# Patient Record
Sex: Female | Born: 1985 | Hispanic: No | Marital: Married | Smoking: Never smoker
Health system: Southern US, Community
[De-identification: ages and names within clinical notes are randomized; demographics above are authoritative.]

## PROBLEM LIST (undated history)

## (undated) DIAGNOSIS — R51 Headache: Secondary | ICD-10-CM

## (undated) DIAGNOSIS — N809 Endometriosis, unspecified: Secondary | ICD-10-CM

## (undated) DIAGNOSIS — D219 Benign neoplasm of connective and other soft tissue, unspecified: Secondary | ICD-10-CM

## (undated) DIAGNOSIS — R748 Abnormal levels of other serum enzymes: Secondary | ICD-10-CM

## (undated) DIAGNOSIS — N946 Dysmenorrhea, unspecified: Secondary | ICD-10-CM

## (undated) DIAGNOSIS — K279 Peptic ulcer, site unspecified, unspecified as acute or chronic, without hemorrhage or perforation: Secondary | ICD-10-CM

## (undated) DIAGNOSIS — K219 Gastro-esophageal reflux disease without esophagitis: Secondary | ICD-10-CM

## (undated) DIAGNOSIS — IMO0002 Reserved for concepts with insufficient information to code with codable children: Secondary | ICD-10-CM

## (undated) DIAGNOSIS — R519 Headache, unspecified: Secondary | ICD-10-CM

## (undated) DIAGNOSIS — G971 Other reaction to spinal and lumbar puncture: Secondary | ICD-10-CM

## (undated) HISTORY — PX: UPPER GI ENDOSCOPY: SHX6162

## (undated) HISTORY — DX: Peptic ulcer, site unspecified, unspecified as acute or chronic, without hemorrhage or perforation: K27.9

## (undated) HISTORY — DX: Endometriosis, unspecified: N80.9

## (undated) HISTORY — PX: NO PAST SURGERIES: SHX2092

## (undated) HISTORY — DX: Abnormal levels of other serum enzymes: R74.8

---

## 2012-06-12 DIAGNOSIS — K279 Peptic ulcer, site unspecified, unspecified as acute or chronic, without hemorrhage or perforation: Secondary | ICD-10-CM

## 2012-06-12 HISTORY — DX: Peptic ulcer, site unspecified, unspecified as acute or chronic, without hemorrhage or perforation: K27.9

## 2016-04-06 ENCOUNTER — Encounter (HOSPITAL_COMMUNITY): Payer: Self-pay | Admitting: *Deleted

## 2016-04-06 ENCOUNTER — Inpatient Hospital Stay (HOSPITAL_COMMUNITY)
Admission: AD | Admit: 2016-04-06 | Discharge: 2016-04-06 | Disposition: A | Discharge status: Home / Self Care | Payer: Self-pay | Source: Ambulatory Visit | Attending: Obstetrics and Gynecology | Admitting: Obstetrics and Gynecology

## 2016-04-06 DIAGNOSIS — N946 Dysmenorrhea, unspecified: Secondary | ICD-10-CM

## 2016-04-06 HISTORY — DX: Headache, unspecified: R51.9

## 2016-04-06 HISTORY — DX: Benign neoplasm of connective and other soft tissue, unspecified: D21.9

## 2016-04-06 HISTORY — DX: Headache: R51

## 2016-04-06 HISTORY — DX: Dysmenorrhea, unspecified: N94.6

## 2016-04-06 LAB — CBC WITH DIFFERENTIAL/PLATELET
BASOS ABS: 0.1 10*3/uL (ref 0.0–0.1)
BASOS PCT: 0 %
Eosinophils Absolute: 0.5 10*3/uL (ref 0.0–0.7)
Eosinophils Relative: 4 %
HEMATOCRIT: 31.7 % — AB (ref 36.0–46.0)
HEMOGLOBIN: 10.2 g/dL — AB (ref 12.0–15.0)
LYMPHS PCT: 15 %
Lymphs Abs: 2 10*3/uL (ref 0.7–4.0)
MCH: 21.7 pg — ABNORMAL LOW (ref 26.0–34.0)
MCHC: 32.2 g/dL (ref 30.0–36.0)
MCV: 67.4 fL — AB (ref 78.0–100.0)
Monocytes Absolute: 0.4 10*3/uL (ref 0.1–1.0)
Monocytes Relative: 3 %
NEUTROS ABS: 10.5 10*3/uL — AB (ref 1.7–7.7)
NEUTROS PCT: 78 %
Platelets: 282 10*3/uL (ref 150–400)
RBC: 4.7 MIL/uL (ref 3.87–5.11)
RDW: 15.7 % — AB (ref 11.5–15.5)
WBC: 13.4 10*3/uL — AB (ref 4.0–10.5)

## 2016-04-06 LAB — URINALYSIS, ROUTINE W REFLEX MICROSCOPIC
BILIRUBIN URINE: NEGATIVE
GLUCOSE, UA: NEGATIVE mg/dL
Ketones, ur: NEGATIVE mg/dL
Leukocytes, UA: NEGATIVE
Nitrite: NEGATIVE
Protein, ur: NEGATIVE mg/dL
SPECIFIC GRAVITY, URINE: 1.01 (ref 1.005–1.030)
pH: 7.5 (ref 5.0–8.0)

## 2016-04-06 LAB — COMPREHENSIVE METABOLIC PANEL
ALBUMIN: 4.1 g/dL (ref 3.5–5.0)
ALK PHOS: 50 U/L (ref 38–126)
ALT: 14 U/L (ref 14–54)
AST: 17 U/L (ref 15–41)
Anion gap: 8 (ref 5–15)
BILIRUBIN TOTAL: 0.6 mg/dL (ref 0.3–1.2)
BUN: 7 mg/dL (ref 6–20)
CO2: 21 mmol/L — ABNORMAL LOW (ref 22–32)
CREATININE: 0.57 mg/dL (ref 0.44–1.00)
Calcium: 8.5 mg/dL — ABNORMAL LOW (ref 8.9–10.3)
Chloride: 108 mmol/L (ref 101–111)
GFR calc Af Amer: >60 mL/min (ref >60)
GFR calc non Af Amer: >60 mL/min (ref >60)
GLUCOSE: 109 mg/dL — AB (ref 65–99)
POTASSIUM: 3.7 mmol/L (ref 3.5–5.1)
Sodium: 137 mmol/L (ref 135–145)
TOTAL PROTEIN: 6.9 g/dL (ref 6.5–8.1)

## 2016-04-06 LAB — URINE MICROSCOPIC-ADD ON
BACTERIA UA: NONE SEEN
WBC, UA: NONE SEEN WBC/hpf (ref 0–5)

## 2016-04-06 LAB — POCT PREGNANCY, URINE: Preg Test, Ur: NEGATIVE

## 2016-04-06 MED ORDER — HYDROMORPHONE HCL 1 MG/ML IJ SOLN
0.5000 mg | Freq: Once | INTRAMUSCULAR | Status: AC
Start: 2016-04-06 — End: 2016-04-06
  Administered 2016-04-06: 0.5 mg via INTRAMUSCULAR
  Filled 2016-04-06: qty 1

## 2016-04-06 MED ORDER — IBUPROFEN 600 MG PO TABS: 600.0000 mg | ORAL_TABLET | Freq: Four times a day (QID) | ORAL | 1 refills | Status: DC | PRN

## 2016-04-06 MED ORDER — TRAMADOL HCL 50 MG PO TABS: 50.0000 mg | ORAL_TABLET | Freq: Four times a day (QID) | ORAL | 0 refills | Status: DC | PRN

## 2016-04-06 MED ORDER — KETOROLAC TROMETHAMINE 60 MG/2ML IM SOLN
60.0000 mg | Freq: Once | INTRAMUSCULAR | Status: AC
  Administered 2016-04-06: 60 mg via INTRAMUSCULAR
  Filled 2016-04-06: qty 2

## 2016-04-06 MED ORDER — ONDANSETRON HCL 4 MG PO TABS
4.0000 mg | ORAL_TABLET | Freq: Once | ORAL | Status: AC
  Administered 2016-04-06: 4 mg via ORAL
  Filled 2016-04-06: qty 1

## 2016-04-06 NOTE — MAU Note (Signed)
Pt's doctor gave her medicine for painful periods and she has not had a period for 3 months; now pt is having aperiod with moderate flow but severe cramping; hx of fibroids in her uterus and dysmenorrhea;

## 2016-04-06 NOTE — Discharge Instructions (Signed)
Dysmenorrhea °Menstrual cramps (dysmenorrhea) are caused by the muscles of the uterus tightening (contracting) during a menstrual period. For some women, this discomfort is merely bothersome. For others, dysmenorrhea can be severe enough to interfere with everyday activities for a few days each month. °Primary dysmenorrhea is menstrual cramps that last a couple of days when you start having menstrual periods or soon after. This often begins after a teenager starts having her period. As a woman gets older or has a baby, the cramps will usually lessen or disappear. Secondary dysmenorrhea begins later in life, lasts longer, and the pain may be stronger than primary dysmenorrhea. The pain may start before the period and last a few days after the period.  °CAUSES  °Dysmenorrhea is usually caused by an underlying problem, such as: °· The tissue lining the uterus grows outside of the uterus in other areas of the body (endometriosis). °· The endometrial tissue, which normally lines the uterus, is found in or grows into the muscular walls of the uterus (adenomyosis). °· The pelvic blood vessels are engorged with blood just before the menstrual period (pelvic congestive syndrome). °· Overgrowth of cells (polyps) in the lining of the uterus or cervix. °· Falling down of the uterus (prolapse) because of loose or stretched ligaments. °· Depression. °· Bladder problems, infection, or inflammation. °· Problems with the intestine, a tumor, or irritable bowel syndrome. °· Cancer of the female organs or bladder. °· A severely tipped uterus. °· A very tight opening or closed cervix. °· Noncancerous tumors of the uterus (fibroids). °· Pelvic inflammatory disease (PID). °· Pelvic scarring (adhesions) from a previous surgery. °· Ovarian cyst. °· An intrauterine device (IUD) used for birth control. °RISK FACTORS °You may be at greater risk of dysmenorrhea if: °· You are younger than age 30. °· You started puberty early. °· You have  irregular or heavy bleeding. °· You have never given birth. °· You have a family history of this problem. °· You are a smoker. °SIGNS AND SYMPTOMS  °· Cramping or throbbing pain in your lower abdomen. °· Headaches. °· Lower back pain. °· Nausea or vomiting. °· Diarrhea. °· Sweating or dizziness. °· Loose stools. °DIAGNOSIS  °A diagnosis is based on your history, symptoms, physical exam, diagnostic tests, or procedures. Diagnostic tests or procedures may include: °· Blood tests. °· Ultrasonography. °· An examination of the lining of the uterus (dilation and curettage, D&C). °· An examination inside your abdomen or pelvis with a scope (laparoscopy). °· X-rays. °· CT scan. °· MRI. °· An examination inside the bladder with a scope (cystoscopy). °· An examination inside the intestine or stomach with a scope (colonoscopy, gastroscopy). °TREATMENT  °Treatment depends on the cause of the dysmenorrhea. Treatment may include: °· Pain medicine prescribed by your health care provider. °· Birth control pills or an IUD with progesterone hormone in it. °· Hormone replacement therapy. °· Nonsteroidal anti-inflammatory drugs (NSAIDs). These may help stop the production of prostaglandins. °· Surgery to remove adhesions, endometriosis, ovarian cyst, or fibroids. °· Removal of the uterus (hysterectomy). °· Progesterone shots to stop the menstrual period. °· Cutting the nerves on the sacrum that go to the female organs (presacral neurectomy). °· Electric current to the sacral nerves (sacral nerve stimulation). °· Antidepressant medicine. °· Psychiatric therapy, counseling, or group therapy. °· Exercise and physical therapy. °· Meditation and yoga therapy. °· Acupuncture. °HOME CARE INSTRUCTIONS  °· Only take over-the-counter or prescription medicines as directed by your health care provider. °· Place a heating pad   or hot water bottle on your lower back or abdomen. Do not sleep with the heating pad.  Use aerobic exercises, walking,  swimming, biking, and other exercises to help lessen the cramping.  Massage to the lower back or abdomen may help.  Stop smoking.  Avoid alcohol and caffeine. SEEK MEDICAL CARE IF:   Your pain does not get better with medicine.  You have pain with sexual intercourse.  Your pain increases and is not controlled with medicines.  You have abnormal vaginal bleeding with your period.  You develop nausea or vomiting with your period that is not controlled with medicine. SEEK IMMEDIATE MEDICAL CARE IF:  You pass out.    This information is not intended to replace advice given to you by your health care provider. Make sure you discuss any questions you have with your health care provider.   Document Released: 05/29/2005 Document Revised: 01/29/2013 Document Reviewed: 11/14/2012 Elsevier Interactive Patient Education 2016 [facility]    [facility]  [phone]     Phone: [place]                      [facility]  [email or web address]  [place]     Phone: [phone]  Phone: [place]     Phone: [phone]  Phone: [phone]           [place] [facility] @ [place]                hone: [phone]  Phone: [phone]         [facility] [address]      Phone: [phone]  Phone: [phone]         [place] [facility]                Phone: [phone]  [place])          Phone: [phone] [facility]   Phone: [phone]

## 2016-04-06 NOTE — MAU Provider Note (Signed)
Chief Complaint:  Abdominal Pain   First Provider Initiated Contact with Patient 04/06/16 1833       HPI: Sally Williams is a 30 y.o. G0P0000 who presents to maternity admissions reporting severe lower pelvic cramping which started when her period started 2 days ago, but got worse today. States this is typical of her usual periods. But has been given meds in [place] to stop her period for 3 months, so it has been a while since she has had one.  States has one 2cm fibroid in her uterus.  Took one med at home which is not on [place] formularies.  Husband states it is an antispasmodic.  Did not try any NSAIDs. She reports vaginal bleeding, no vaginal itching/burning, urinary symptoms, h/a, dizziness, n/v, or fever/chills.    Pelvic Pain  The patient's primary symptoms include pelvic pain and vaginal bleeding. The patient's pertinent negatives include no genital itching, genital lesions or genital odor. This is a recurrent problem. The current episode started in the past 7 days. The problem occurs constantly. The problem has been unchanged. The pain is severe. The problem affects both sides. She is not pregnant. Associated symptoms include abdominal pain. Pertinent negatives include no back pain, constipation, diarrhea, fever, headaches, nausea or vomiting. The vaginal discharge was bloody. The vaginal bleeding is typical of menses. She has not been passing clots. She has not been passing tissue. Nothing aggravates the symptoms. Treatments tried: [place] medicine. The treatment provided mild relief. She is sexually active. No, her partner does not have an STD. She uses nothing for contraception.   RN Note: Pt's doctor gave her medicine for painful periods and she has not had a period for 3 months; now pt is having aperiod with moderate flow but severe cramping; hx of fibroids in her uterus and dysmenorrhea; Pt reports she has had severe abd pain with her period x 3 days. Usually has bad pain with her  periods. When she was in [place]  3 months ago was given "Ilioroid" medicine to help with the pain with her period. Did not have a period for the 3 months while she was on it and now her period has stared again and she has a lot of pain.  Past Medical History: Past Medical History:  Diagnosis Date  . Dysmenorrhea   . Fibroid   . Headache     Past obstetric history: OB History  Gravida Para Term Preterm AB Living  0 0 0 0 0 0  SAB TAB Ectopic Multiple Live Births  0 0 0 0 0        Past Surgical History: Past Surgical History:  Procedure Laterality Date  . NO PAST SURGERIES      Family History: No family history on file.  Social History: Social History  Substance Use Topics  . Smoking status: Never Smoker  . Smokeless tobacco: Never Used  . Alcohol use No    Allergies: Allergies no known allergies  Meds:  No prescriptions prior to admission.    I have reviewed patient's Past Medical Hx, Surgical Hx, Family Hx, Social Hx, medications and allergies.  ROS:  Review of Systems  Constitutional: Negative for fever.  Gastrointestinal: Positive for abdominal pain. Negative for constipation, diarrhea, nausea and vomiting.  Genitourinary: Positive for pelvic pain.  Musculoskeletal: Negative for back pain.  Neurological: Negative for headaches.   Other systems negative     Physical Exam  Patient Vitals for the past 24 hrs:  BP Temp Temp src Pulse  Resp Height Weight  04/06/16 1758 138/75 98.7 F (37.1 C) Oral 63 18 4\' 10"  (1.473 m) 117 lb 12.8 oz (53.4 kg)   Constitutional: Well-developed, well-nourished female in no acute distress.  Cardiovascular: normal rate and rhythm, no ectopy audible, S1 & S2 heard, no murmur Respiratory: normal effort, no distress. Lungs CTAB with no wheezes or crackles GI: Abd soft, tender over lower abdomen.  Nondistended.  No localized tenderness . No rebound, No guarding.  Bowel Sounds audible  MS: Extremities nontender, no edema,  normal ROM Neurologic: Alert and oriented x 4.   Grossly nonfocal. GU: Neg CVAT. Skin:  Warm and Dry Psych:  Affect appropriate.  PELVIC EXAM: Refused by patient   Labs: Results for orders placed or performed during the hospital encounter of 04/06/16 (from the past 24 hour(s))  Urinalysis, Routine w reflex microscopic (not at [facility])     Status: Abnormal   Collection Time: 04/06/16  6:00 PM  Result Value Ref Range   Color, Urine YELLOW YELLOW   APPearance CLEAR CLEAR   Specific Gravity, Urine 1.010 1.005 - 1.030   pH 7.5 5.0 - 8.0   Glucose, UA NEGATIVE NEGATIVE mg/dL   Hgb urine dipstick MODERATE (A) NEGATIVE   Bilirubin Urine NEGATIVE NEGATIVE   Ketones, ur NEGATIVE NEGATIVE mg/dL   Protein, ur NEGATIVE NEGATIVE mg/dL   Nitrite NEGATIVE NEGATIVE   Leukocytes, UA NEGATIVE NEGATIVE  Urine microscopic-add on     Status: Abnormal   Collection Time: 04/06/16  6:00 PM  Result Value Ref Range   Squamous Epithelial / LPF 0-5 (A) NONE SEEN   WBC, UA NONE SEEN 0 - 5 WBC/hpf   RBC / HPF 6-30 0 - 5 RBC/hpf   Bacteria, UA NONE SEEN NONE SEEN  Pregnancy, urine POC     Status: None   Collection Time: 04/06/16  6:18 PM  Result Value Ref Range   Preg Test, Ur NEGATIVE NEGATIVE  CBC with Differential/Platelet     Status: Abnormal   Collection Time: 04/06/16  7:12 PM  Result Value Ref Range   WBC 13.4 (H) 4.0 - 10.5 K/uL   RBC 4.70 3.87 - 5.11 MIL/uL   Hemoglobin 10.2 (L) 12.0 - 15.0 g/dL   HCT 31.7 (L) 36.0 - 46.0 %   MCV 67.4 (L) 78.0 - 100.0 fL   MCH 21.7 (L) 26.0 - 34.0 pg   MCHC 32.2 30.0 - 36.0 g/dL   RDW 15.7 (H) 11.5 - 15.5 %   Platelets 282 150 - 400 K/uL   Neutrophils Relative % 78 %   Neutro Abs 10.5 (H) 1.7 - 7.7 K/uL   Lymphocytes Relative 15 %   Lymphs Abs 2.0 0.7 - 4.0 K/uL   Monocytes Relative 3 %   Monocytes Absolute 0.4 0.1 - 1.0 K/uL   Eosinophils Relative 4 %   Eosinophils Absolute 0.5 0.0 - 0.7 K/uL   Basophils Relative 0 %   Basophils Absolute 0.1 0.0 - 0.1  K/uL  Comprehensive metabolic panel     Status: Abnormal   Collection Time: 04/06/16  7:12 PM  Result Value Ref Range   Sodium 137 135 - 145 mmol/L   Potassium 3.7 3.5 - 5.1 mmol/L   Chloride 108 101 - 111 mmol/L   CO2 21 (L) 22 - 32 mmol/L   Glucose, Bld 109 (H) 65 - 99 mg/dL   BUN 7 6 - 20 mg/dL   Creatinine, Ser 0.57 0.44 - 1.00 mg/dL   Calcium 8.5 (L) 8.9 -  10.3 mg/dL   Total Protein 6.9 6.5 - 8.1 g/dL   Albumin 4.1 3.5 - 5.0 g/dL   AST 17 15 - 41 U/L   ALT 14 14 - 54 U/L   Alkaline Phosphatase 50 38 - 126 U/L   Total Bilirubin 0.6 0.3 - 1.2 mg/dL   GFR calc non Af Amer >60 >60 mL/min   GFR calc Af Amer >60 >60 mL/min   Anion gap 8 5 - 15    Imaging:  No results found.  MAU Course/MDM: I have ordered labs as follows:  CBC, Cmet, UA Imaging ordered: none Results reviewed.   Consult Dr Rip Harbour.  This seems consistent with dysmenorrhea, but since her WBC was 13, I asked if I needed to do other evaluation and he stated that this is not necessary.  .   Treatments in MAU included Toradol which provided some relief. Gave her one dose of 0.5mg  Dilaudid due to severity of her pain, which relieved it to a great extent..   Pt stable at time of discharge.  Assessment: Dysmenorrhea  Plan: Discharge home Recommend Find a GYN doctor to follow up with. Rx Ibuprofen for dysmenorrhea Rx Tramadol for severe pain, limited Rx #15 with no refills.   Encouraged to return here or to other Urgent Care/ED if she develops worsening of symptoms, increase in pain, fever, or other concerning symptoms.   Hansel Feinstein CNM, MSN Certified Nurse-Midwife 04/06/2016 7:08 PM

## 2016-04-06 NOTE — MAU Note (Signed)
Pt reports she has had severe abd pain with her period x 3 days. Usually has bad pain with her periods. When she was in [place]  3 months ago was given "Ilioroid" medicine to help with the pain with her period. Did not have a period for the 3 months while she was on it and now her period has stared again and she has a lot of pain.

## 2016-04-14 ENCOUNTER — Inpatient Hospital Stay (HOSPITAL_COMMUNITY): Payer: Self-pay

## 2016-04-14 ENCOUNTER — Encounter (HOSPITAL_COMMUNITY): Payer: Self-pay | Admitting: *Deleted

## 2016-04-14 ENCOUNTER — Inpatient Hospital Stay (HOSPITAL_COMMUNITY)
Admission: AD | Admit: 2016-04-14 | Discharge: 2016-04-14 | Disposition: A | Discharge status: Home / Self Care | Payer: Self-pay | Source: Ambulatory Visit | Attending: Obstetrics & Gynecology | Admitting: Obstetrics & Gynecology

## 2016-04-14 DIAGNOSIS — D259 Leiomyoma of uterus, unspecified: Secondary | ICD-10-CM | POA: Insufficient documentation

## 2016-04-14 DIAGNOSIS — R102 Pelvic and perineal pain: Secondary | ICD-10-CM | POA: Insufficient documentation

## 2016-04-14 MED ORDER — ONDANSETRON 8 MG PO TBDP
8.0000 mg | ORAL_TABLET | Freq: Once | ORAL | Status: AC
  Administered 2016-04-14: 8 mg via ORAL
  Filled 2016-04-14: qty 1

## 2016-04-14 MED ORDER — HYDROMORPHONE HCL 1 MG/ML IJ SOLN
1.0000 mg | Freq: Once | INTRAMUSCULAR | Status: AC
  Administered 2016-04-14: 1 mg via INTRAMUSCULAR
  Filled 2016-04-14: qty 1

## 2016-04-14 MED ORDER — TRAMADOL HCL 50 MG PO TABS: 50.0000 mg | ORAL_TABLET | Freq: Four times a day (QID) | ORAL | 0 refills | Status: DC | PRN

## 2016-04-14 NOTE — MAU Provider Note (Signed)
History     CSN: [id number]  Arrival date and time: 04/14/16 0053   None     Chief Complaint  Patient presents with  . Pelvic Pain   Sally Williams is a 30 y.o. G0P0000 who presents with abdominal pain. She was seen here on 04/06/16. She states that she was fine when she was taking the medicaiton she was given, but she has run out of medication. She reports that her period ended about 2 days ago.    Pelvic Pain  The patient's primary symptoms include pelvic pain. This is a new problem. The current episode started in the past 7 days. The problem occurs intermittently. The problem has been unchanged. The pain is severe. The problem affects the right side. She is not pregnant. Associated symptoms include abdominal pain, constipation, nausea and vomiting. Pertinent negatives include no diarrhea, dysuria, fever, frequency or urgency. The vaginal discharge was normal. There has been no bleeding. Nothing aggravates the symptoms. She has tried NSAIDs for the symptoms. The treatment provided no relief. She uses nothing for contraception.   Past Medical History:  Diagnosis Date  . Dysmenorrhea   . Fibroid   . Headache     Past Surgical History:  Procedure Laterality Date  . NO PAST SURGERIES      History reviewed. No pertinent family history.  Social History  Substance Use Topics  . Smoking status: Never Smoker  . Smokeless tobacco: Never Used  . Alcohol use No    Allergies: No Known Allergies  Prescriptions Prior to Admission  Medication Sig Dispense Refill Last Dose  . ibuprofen (ADVIL,MOTRIN) 600 MG tablet Take 1 tablet (600 mg total) by mouth every 6 (six) hours as needed. 30 tablet 1 04/13/2016 at 2330  . traMADol (ULTRAM) 50 MG tablet Take 1 tablet (50 mg total) by mouth every 6 (six) hours as needed. 15 tablet 0 04/13/2016    Review of Systems  Constitutional: Negative for fever.  Gastrointestinal: Positive for abdominal pain, constipation, nausea and vomiting. Negative  for diarrhea.  Genitourinary: Positive for pelvic pain. Negative for dysuria, frequency and urgency.   Physical Exam   Blood pressure 145/65, pulse 64, temperature 97.8 F (36.6 C), resp. rate 18, height 4\' 9"  (1.448 m), weight 119 lb 6 oz (54.1 kg), last menstrual period 04/03/2016, SpO2 100 %.  Physical Exam  Nursing note and vitals reviewed. Constitutional: She is oriented to person, place, and time. She appears well-developed and well-nourished. No distress.  HENT:  Head: Normocephalic.  Cardiovascular: Normal rate.   GI: Soft. There is tenderness. There is no rebound.  Neurological: She is alert and oriented to person, place, and time.  Skin: Skin is warm and dry.  Psychiatric: She has a normal mood and affect.   US Transvaginal Non-ob  Result Date: 04/14/2016 CLINICAL DATA:  Ultrasound was provided for use by the ordering physician, and a technical charge was applied by the performing facility.  No radiologist interpretation/professional services rendered.   US Pelvis Complete  Result Date: 04/14/2016 CLINICAL DATA:  30 y/o F; dysmenorrhea, known fibroids, lower pelvic pain severe for 1 week. Worse today. EXAM: TRANSABDOMINAL AND TRANSVAGINAL ULTRASOUND OF PELVIS TECHNIQUE: Both transabdominal and transvaginal ultrasound examinations of the pelvis were performed. Transabdominal technique was performed for global imaging of the pelvis including uterus, ovaries, adnexal regions, and pelvic cul-de-sac. It was necessary to proceed with endovaginal exam following the transabdominal exam to visualize the uterus. COMPARISON:  None FINDINGS: Uterus Measurements: 9.7 x 5.6 x 5.4  cm. Posterior lower myometrial fibroma measuring 1.4 x 1.2 x 1.4 cm. Posterior upper myometrial 4.3 x 3.5 x 3.8 cm fibroid. Subcentimeter cyst at the cervix, likely nabothian. Endometrium Thickness: 11 mm.  No focal abnormality visualized. Right ovary Measurements: 1.7 x 1.2 x 1.6 cm. Normal appearance/no adnexal mass.  Left ovary Measurements: 2.6 x 2.3 x 2.0 cm. Normal appearance/no adnexal mass. Benign left ovarian follicle for Other findings Trace pelvic fluid is likely physiologic. IMPRESSION: No acute process identified.  Multiple uterine fibroids. Electronically Signed   By: Kristine Garbe M.D.   On: 04/14/2016 03:38     MAU Course  Procedures  MDM  Assessment and Plan   1. Uterine leiomyoma, unspecified location   2. Pelvic pain in female    DC home Comfort measures reviewed  RX: tramadol PRN #15  Return to MAU as needed   [place] [facility] .   Specialty:  Obstetrics and Gynecology Why:  as scheduled for 05/18/16 at 3:00pm Contact information: [place] [phone]           Mathis Bud 04/14/2016, 1:34 AM

## 2016-04-14 NOTE — Discharge Instructions (Signed)
Uterine Fibroids Uterine fibroids are tissue masses (tumors) that can develop in the womb (uterus). They are also called leiomyomas. This type of tumor is not cancerous (benign) and does not spread to other parts of the body outside of the pelvic area, which is between the hip bones. Occasionally, fibroids may develop in the fallopian tubes, in the cervix, or on the support structures (ligaments) that surround the uterus. You can have one or many fibroids. Fibroids can vary in size, weight, and where they grow in the uterus. Some can become quite large. Most fibroids do not require medical treatment. CAUSES A fibroid can develop when a single uterine cell keeps growing (replicating). Most cells in the human body have a control mechanism that keeps them from replicating without control. SIGNS AND SYMPTOMS Symptoms may include:   Heavy bleeding during your period.  Bleeding or spotting between periods.  Pelvic pain and pressure.  Bladder problems, such as needing to urinate more often (urinary frequency) or urgently.  Inability to reproduce offspring (infertility).  Miscarriages. DIAGNOSIS Uterine fibroids are diagnosed through a physical exam. Your health care provider may feel the lumpy tumors during a pelvic exam. Ultrasonography and an MRI may be done to determine the size, location, and number of fibroids. TREATMENT Treatment may include:  Watchful waiting. This involves getting the fibroid checked by your health care provider to see if it grows or shrinks. Follow your health care provider's recommendations for how often to have this checked.  Hormone medicines. These can be taken by mouth or given through an intrauterine device (IUD).  Surgery.  Removing the fibroids (myomectomy) or the uterus (hysterectomy).  Removing blood supply to the fibroids (uterine artery embolization). If fibroids interfere with your fertility and you want to become pregnant, your health care provider  may recommend having the fibroids removed.  HOME CARE INSTRUCTIONS  Keep all follow-up visits as directed by your health care provider. This is important.  Take medicines only as directed by your health care provider.  If you were prescribed a hormone treatment, take the hormone medicines exactly as directed.  Do not take aspirin, because it can cause bleeding.  Ask your health care provider about taking iron pills and increasing the amount of dark green, leafy vegetables in your diet. These actions can help to boost your blood iron levels, which may be affected by heavy menstrual bleeding.  Pay close attention to your period and tell your health care provider about any changes, such as:  Increased blood flow that requires you to use more pads or tampons than usual per month.  A change in the number of days that your period lasts per month.  A change in symptoms that are associated with your period, such as abdominal cramping or back pain. SEEK MEDICAL CARE IF:  You have pelvic pain, back pain, or abdominal cramps that cannot be controlled with medicines.  You have an increase in bleeding between and during periods.  You soak tampons or pads in a half hour or less.  You feel lightheaded, extra tired, or weak. SEEK IMMEDIATE MEDICAL CARE IF:  You faint.  You have a sudden increase in pelvic pain.   This information is not intended to replace advice given to you by your health care provider. Make sure you discuss any questions you have with your health care provider.   Document Released: 05/26/2000 Document Revised: 06/19/2014 Document Reviewed: 11/25/2013 Elsevier Interactive Patient Education 2016 Elsevier Inc.  

## 2016-04-14 NOTE — MAU Note (Signed)
Pt reports she has continued to have pain since, vomiting today.

## 2016-05-01 ENCOUNTER — Encounter: Payer: Self-pay | Admitting: Obstetrics & Gynecology

## 2016-05-01 ENCOUNTER — Ambulatory Visit (INDEPENDENT_AMBULATORY_CARE_PROVIDER_SITE_OTHER): Payer: Self-pay | Admitting: Obstetrics & Gynecology

## 2016-05-01 VITALS — BP 119/80 | HR 80 | Ht <= 58 in | Wt 117.0 lb

## 2016-05-01 DIAGNOSIS — D251 Intramural leiomyoma of uterus: Secondary | ICD-10-CM | POA: Insufficient documentation

## 2016-05-01 DIAGNOSIS — G8929 Other chronic pain: Secondary | ICD-10-CM

## 2016-05-01 DIAGNOSIS — R102 Pelvic and perineal pain: Secondary | ICD-10-CM

## 2016-05-01 DIAGNOSIS — D500 Iron deficiency anemia secondary to blood loss (chronic): Secondary | ICD-10-CM

## 2016-05-01 NOTE — Progress Notes (Signed)
   Subjective:    Patient ID: Sally Williams, female    DOB: [birth year], 30 y.o.   MRN: [id number]  HPI  30 yo M A P0 here today to discuss CPP, started about 10 years ago, worse now. Pain is now daily. In [place] she was given a daily medication for 3 months that stopped her periods. Pain was resolved during that time. Pain is not worse with sex or with her period. She would like to have kids and has not used contraception except the 3 months that the doctor in [place] gave her those meds.  Review of Systems U/s showed several medium size fibroids. Periods are monthly, lasting 7-8 day     Objective:   Physical Exam [id number] Breathing, conversing, ambulating normally Abd- benign       Assessment & Plan:  Preventative health care- declines flu vaccine\ Free pap clinic number given Fibroids, anemia, desire for pregnacy Rec MVI daily, IBU prn Start trying for pregnancy

## 2016-05-07 ENCOUNTER — Encounter (HOSPITAL_COMMUNITY): Payer: Self-pay | Admitting: *Deleted

## 2016-05-07 ENCOUNTER — Inpatient Hospital Stay (HOSPITAL_COMMUNITY)
Admission: AD | Admit: 2016-05-07 | Discharge: 2016-05-07 | Disposition: A | Discharge status: Home / Self Care | Payer: Self-pay | Source: Ambulatory Visit | Attending: Obstetrics and Gynecology | Admitting: Obstetrics and Gynecology

## 2016-05-07 DIAGNOSIS — D259 Leiomyoma of uterus, unspecified: Secondary | ICD-10-CM | POA: Insufficient documentation

## 2016-05-07 DIAGNOSIS — R109 Unspecified abdominal pain: Secondary | ICD-10-CM | POA: Insufficient documentation

## 2016-05-07 DIAGNOSIS — N946 Dysmenorrhea, unspecified: Secondary | ICD-10-CM | POA: Insufficient documentation

## 2016-05-07 DIAGNOSIS — R1030 Lower abdominal pain, unspecified: Secondary | ICD-10-CM

## 2016-05-07 DIAGNOSIS — Z79899 Other long term (current) drug therapy: Secondary | ICD-10-CM | POA: Insufficient documentation

## 2016-05-07 LAB — URINALYSIS, ROUTINE W REFLEX MICROSCOPIC
BILIRUBIN URINE: NEGATIVE
Glucose, UA: NEGATIVE mg/dL
Hgb urine dipstick: NEGATIVE
KETONES UR: NEGATIVE mg/dL
LEUKOCYTES UA: NEGATIVE
NITRITE: NEGATIVE
Protein, ur: NEGATIVE mg/dL
SPECIFIC GRAVITY, URINE: 1.01 (ref 1.005–1.030)
pH: 6 (ref 5.0–8.0)

## 2016-05-07 LAB — POCT PREGNANCY, URINE: PREG TEST UR: NEGATIVE

## 2016-05-07 LAB — WET PREP, GENITAL
Clue Cells Wet Prep HPF POC: NONE SEEN
Sperm: NONE SEEN
Trich, Wet Prep: NONE SEEN
YEAST WET PREP: NONE SEEN

## 2016-05-07 MED ORDER — LEVONORGEST-ETH ESTRAD 91-DAY 0.15-0.03 MG PO TABS: 1.0000 | ORAL_TABLET | Freq: Every day | ORAL | 1 refills | Status: DC

## 2016-05-07 MED ORDER — KETOROLAC TROMETHAMINE 60 MG/2ML IM SOLN
60.0000 mg | Freq: Once | INTRAMUSCULAR | Status: DC
  Filled 2016-05-07: qty 2

## 2016-05-07 MED ORDER — TRAMADOL HCL 50 MG PO TABS: 50.0000 mg | ORAL_TABLET | Freq: Four times a day (QID) | ORAL | 0 refills | Status: DC | PRN

## 2016-05-07 MED ORDER — HYDROMORPHONE HCL 1 MG/ML IJ SOLN
1.0000 mg | INTRAMUSCULAR | Status: DC | PRN
  Administered 2016-05-07: 1 mg via INTRAMUSCULAR
  Filled 2016-05-07: qty 1

## 2016-05-07 NOTE — MAU Provider Note (Signed)
History   Patient Sally Williams is a 30 year old G0P0 here with abdominal pain. Pain started this morning at 5 am. It is similar to past fibroid pain that she has had. She has been to MAU for this pain in the past and had an appointment with Dr. Hulan Fray on 05/01/2016.   CSN: [id number]  Arrival date and time: 05/07/16 [facility]      Chief Complaint  Patient presents with  . Abdominal Pain  . Fibroids   Subjective:   Harmany Yamada is a 30 y.o. female who presents for evaluation of abdominal pain. Onset was at 5:00 am. Symptoms have been unchanged. The pain is described as aching and 10/10 in intensity. Pain is located in generalized abdominal region. .  Aggravating factors: fibroids. The patient denies vaginal discharge, bleeding, fever.      OB History    Gravida Para Term Preterm AB Living   0 0 0 0 0 0   SAB TAB Ectopic Multiple Live Births   0 0 0 0 0      Past Medical History:  Diagnosis Date  . Dysmenorrhea   . Fibroid   . Headache     Past Surgical History:  Procedure Laterality Date  . NO PAST SURGERIES      History reviewed. No pertinent family history.  Social History  Substance Use Topics  . Smoking status: Never Smoker  . Smokeless tobacco: Never Used  . Alcohol use No    Allergies: No Known Allergies  Prescriptions Prior to Admission  Medication Sig Dispense Refill Last Dose  . ibuprofen (ADVIL,MOTRIN) 600 MG tablet Take 1 tablet (600 mg total) by mouth every 6 (six) hours as needed. 30 tablet 1 Taking  . traMADol (ULTRAM) 50 MG tablet Take 1 tablet (50 mg total) by mouth every 6 (six) hours as needed. 15 tablet 0 Taking    Review of Systems  Constitutional: Negative.   HENT: Negative.   Eyes: Negative.   Respiratory: Negative.   Cardiovascular: Negative.   Gastrointestinal: Positive for abdominal pain. Negative for blood in stool, constipation, diarrhea, heartburn, melena, nausea and vomiting.  Genitourinary: Negative.   Musculoskeletal:  Negative.   Skin: Negative.   Neurological: Negative.   Endo/Heme/Allergies: Negative.   Psychiatric/Behavioral: Negative.    Physical Exam   Blood pressure 134/95, pulse 67, temperature 98.1 F (36.7 C), resp. rate 22, height 4\' 9"  (1.448 m), weight 52.7 kg (116 lb 3.2 oz), last menstrual period 04/29/2016.  Physical Exam  Constitutional: She is oriented to person, place, and time. She appears well-developed.  HENT:  Head: Normocephalic.  Neck: Normal range of motion.  Respiratory: Effort normal.  GI: Soft. Bowel sounds are normal. She exhibits no distension and no mass. There is tenderness. There is no rebound and no guarding.  Genitourinary: Vagina normal and uterus normal.  Neurological: She is alert and oriented to person, place, and time.  Skin: Skin is warm and dry.    MAU Course  Procedures Results for orders placed or performed during the hospital encounter of 05/07/16 (from the past 24 hour(s))  Urinalysis, Routine w reflex microscopic (not at [facility])     Status: None   Collection Time: 05/07/16  6:55 AM  Result Value Ref Range   Color, Urine YELLOW YELLOW   APPearance CLEAR CLEAR   Specific Gravity, Urine 1.010 1.005 - 1.030   pH 6.0 5.0 - 8.0   Glucose, UA NEGATIVE NEGATIVE mg/dL   Hgb urine dipstick NEGATIVE NEGATIVE  Bilirubin Urine NEGATIVE NEGATIVE   Ketones, ur NEGATIVE NEGATIVE mg/dL   Protein, ur NEGATIVE NEGATIVE mg/dL   Nitrite NEGATIVE NEGATIVE   Leukocytes, UA NEGATIVE NEGATIVE  Pregnancy, urine POC     Status: None   Collection Time: 05/07/16  7:04 AM  Result Value Ref Range   Preg Test, Ur NEGATIVE NEGATIVE  Wet prep, genital     Status: Abnormal   Collection Time: 05/07/16  8:01 AM  Result Value Ref Range   Yeast Wet Prep HPF POC NONE SEEN NONE SEEN   Trich, Wet Prep NONE SEEN NONE SEEN   Clue Cells Wet Prep HPF POC NONE SEEN NONE SEEN   WBC, Wet Prep HPF POC FEW (A) NONE SEEN   Sperm NONE SEEN     MDM -pelvic exam and speculum exam. GC  CT cultures sent.  -1 mg of dilaudid for pain  Care assumed by Butch Penny, NP at 2000 Long discussion with client about her plans for pain management and conception.  Client was thinking her pain would go away with the Ibuprofen.  Was not fully aware of the periodic nature of pain with fibroids.  Discussed levels of pain management - ibuprofen and tylenol (client worried about longterm ibuprofen as she has a diagnosed gastric ulcer).  Will prescribe some tramadol for severe episodes - not daily use.  Will use ibuprofen as needed for moderate pain (OTC bottle - advised 3 tablets every 6 hours and 4 tablets every 8 hours).  Will begin birth control pills daily to keep menses from coming as she is not yet ready to conceive.  Has used pills previously and was not in pain from fibroids.  Assessment and Plan  Abdominal pain from fibroids  Plan Pain management from ibuprofen, tylenol  Rx tramadol - #15 tablets Rx seasonale #1 pack Follow up in the [facility] if this plan is not working. Earlie Server, NP  Mervyn Skeeters [place] 05/07/2016, 7:39 AM

## 2016-05-07 NOTE — MAU Note (Signed)
Fibroid pain. Has been here several times with same thing. Denies vag bleeding or d/c

## 2016-05-07 NOTE — Discharge Instructions (Signed)
Get your medication from the pharmacy and begin the birth control pills today. Take Tramadol only for severe pain. Manage your other pain with ibuprofen or tylenol by the package directions.  You can take 3 tablets of ibuprofen every 6 hours.  Please take ibuprofen with food. Call and make an appointment at the clinic for follow up especially if you are continuing to have pain.

## 2016-05-10 LAB — GC/CHLAMYDIA PROBE AMP (~~LOC~~) NOT AT ARMC
Chlamydia: NEGATIVE
NEISSERIA GONORRHEA: NEGATIVE

## 2016-05-11 ENCOUNTER — Encounter (HOSPITAL_COMMUNITY): Payer: Self-pay | Admitting: *Deleted

## 2016-05-11 ENCOUNTER — Inpatient Hospital Stay (HOSPITAL_COMMUNITY)
Admission: AD | Admit: 2016-05-11 | Discharge: 2016-05-11 | Disposition: A | Discharge status: Home / Self Care | Payer: Self-pay | Source: Ambulatory Visit | Attending: Obstetrics and Gynecology | Admitting: Obstetrics and Gynecology

## 2016-05-11 DIAGNOSIS — D259 Leiomyoma of uterus, unspecified: Secondary | ICD-10-CM | POA: Insufficient documentation

## 2016-05-11 DIAGNOSIS — Z79899 Other long term (current) drug therapy: Secondary | ICD-10-CM | POA: Insufficient documentation

## 2016-05-11 DIAGNOSIS — N946 Dysmenorrhea, unspecified: Secondary | ICD-10-CM | POA: Insufficient documentation

## 2016-05-11 DIAGNOSIS — R1084 Generalized abdominal pain: Secondary | ICD-10-CM | POA: Insufficient documentation

## 2016-05-11 HISTORY — DX: Reserved for concepts with insufficient information to code with codable children: IMO0002

## 2016-05-11 HISTORY — DX: Other reaction to spinal and lumbar puncture: G97.1

## 2016-05-11 LAB — URINALYSIS, ROUTINE W REFLEX MICROSCOPIC
BILIRUBIN URINE: NEGATIVE
Glucose, UA: NEGATIVE mg/dL
Hgb urine dipstick: NEGATIVE
KETONES UR: NEGATIVE mg/dL
Leukocytes, UA: NEGATIVE
NITRITE: NEGATIVE
PH: 6.5 (ref 5.0–8.0)
Protein, ur: NEGATIVE mg/dL
Specific Gravity, Urine: <1.005 — ABNORMAL LOW (ref 1.005–1.030)

## 2016-05-11 LAB — POCT PREGNANCY, URINE: PREG TEST UR: NEGATIVE

## 2016-05-11 MED ORDER — NORGESTIMATE-ETH ESTRADIOL 0.25-35 MG-MCG PO TABS: 1.0000 | ORAL_TABLET | Freq: Every day | ORAL | 2 refills | Status: DC

## 2016-05-11 MED ORDER — ACETAMINOPHEN 650 MG RE SUPP: 650.0000 mg | RECTAL | 0 refills | Status: DC | PRN

## 2016-05-11 MED ORDER — IBUPROFEN 100 MG/5ML PO SUSP: 400.0000 mg | Freq: Four times a day (QID) | ORAL | 1 refills | Status: DC | PRN

## 2016-05-11 NOTE — MAU Provider Note (Signed)
History     CSN: [id number]  Arrival date and time: 05/11/16 1516   First Provider Initiated Contact with Patient 05/11/16 1650      Chief Complaint  Patient presents with  . Abdominal Pain   HPI Sally Williams is a 30 y.o. [place] female who presents for abdominal pain r/t uterine fibroids. Pt has been seen in MAU 4 times in the last month for same complaint. Was seen by Dr. Hulan Fray at [facility] on 11/20 & has follow up with her again on 12/11. Is currently taking ultram, tylenol suppositories, & ibuprofen for abdominal pain with mild relief. Rates pain 10/10 and constant. Nothing makes pain worse. Pain is same as when she was first seen last month. Last week was prescribed OCPs by MAU provider but didn't start taking them d/t cost ($88) & b/c it was prescribed by Dr. Hulan Fray. Vaginal bleeding has ceased. Is requesting rx for liquid or suppository ibuprofen as she states taking tylenol or ibuprofen causes her to vomit.   OB History    Gravida Para Term Preterm AB Living   0 0 0 0 0 0   SAB TAB Ectopic Multiple Live Births   0 0 0 0 0      Past Medical History:  Diagnosis Date  . Dysmenorrhea   . Fibroid   . Headache   . Spinal headache   . Ulcer [facility])     Past Surgical History:  Procedure Laterality Date  . NO PAST SURGERIES      History reviewed. No pertinent family history.  Social History  Substance Use Topics  . Smoking status: Never Smoker  . Smokeless tobacco: Never Used  . Alcohol use No    Allergies: No Known Allergies  Prescriptions Prior to Admission  Medication Sig Dispense Refill Last Dose  . acetaminophen (TYLENOL) 500 MG tablet Take 500 mg by mouth every 6 (six) hours as needed for mild pain, moderate pain or headache.   05/11/2016 at 1330  . acetaminophen (TYLENOL) 80 MG suppository Place 80 mg rectally every 4 (four) hours as needed for mild pain.   05/11/2016 at 1415  . calcium carbonate (TUMS - DOSED IN MG ELEMENTAL CALCIUM) 500 MG chewable tablet Chew 1  tablet by mouth as needed for indigestion or heartburn.   05/10/2016 at Unknown time  . ferrous sulfate 325 (65 FE) MG tablet Take 325 mg by mouth daily with breakfast.   Past Week at Unknown time  . ibuprofen (ADVIL,MOTRIN) 200 MG tablet Take 600-800 mg by mouth every 6 (six) hours as needed for headache, mild pain or moderate pain.   05/11/2016 at 1330  . Multiple Vitamin (MULTIVITAMIN WITH MINERALS) TABS tablet Take 1 tablet by mouth daily.   Past Week at Unknown time  . traMADol (ULTRAM) 50 MG tablet Take 50 mg by mouth every 6 (six) hours as needed for moderate pain.   05/11/2016 at 1400  . levonorgestrel-ethinyl estradiol (SEASONALE,INTROVALE,JOLESSA) 0.15-0.03 MG tablet Take 1 tablet by mouth daily. (Patient not taking: Reported on 05/11/2016) 1 Package 1 Not Taking at Unknown time    Review of Systems  Constitutional: Negative for chills and fever.  Gastrointestinal: Positive for abdominal pain and vomiting (only vomits after taking tylenol or iburprofen). Negative for constipation, diarrhea and nausea.  Genitourinary: Negative.    Physical Exam   Blood pressure 147/73, pulse 64, temperature 98.2 F (36.8 C), temperature source Oral, resp. rate 18, height 4\' 9"  (1.448 m), weight 116 lb (52.6 kg), last  menstrual period 04/29/2016.  Physical Exam  Nursing note and vitals reviewed. Constitutional: She is oriented to person, place, and time. She appears well-developed and well-nourished. No distress.  HENT:  Head: Normocephalic and atraumatic.  Eyes: Conjunctivae are normal. Right eye exhibits no discharge. Left eye exhibits no discharge. No scleral icterus.  Neck: Normal range of motion.  Cardiovascular: Normal rate, regular rhythm and normal heart sounds.   No murmur heard. Respiratory: Effort normal and breath sounds normal. No respiratory distress. She has no wheezes.  GI: Soft. Bowel sounds are normal. She exhibits no distension. There is no tenderness. There is no rebound and no  guarding.  Neurological: She is alert and oriented to person, place, and time.  Skin: Skin is warm and dry. She is not diaphoretic.  Psychiatric: She has a normal mood and affect. Her behavior is normal. Judgment and thought content normal.    MAU Course  Procedures Results for orders placed or performed during the hospital encounter of 05/11/16 (from the past 24 hour(s))  Urinalysis, Routine w reflex microscopic (not at [facility])     Status: Abnormal   Collection Time: 05/11/16  3:37 PM  Result Value Ref Range   Color, Urine YELLOW YELLOW   APPearance CLEAR CLEAR   Specific Gravity, Urine <1.005 (L) 1.005 - 1.030   pH 6.5 5.0 - 8.0   Glucose, UA NEGATIVE NEGATIVE mg/dL   Hgb urine dipstick NEGATIVE NEGATIVE   Bilirubin Urine NEGATIVE NEGATIVE   Ketones, ur NEGATIVE NEGATIVE mg/dL   Protein, ur NEGATIVE NEGATIVE mg/dL   Nitrite NEGATIVE NEGATIVE   Leukocytes, UA NEGATIVE NEGATIVE  Pregnancy, urine POC     Status: None   Collection Time: 05/11/16  3:42 PM  Result Value Ref Range   Preg Test, Ur NEGATIVE NEGATIVE    MDM VSS, NAD UPT negative Patient has already had full workup for this complaint. Reports no changes in symptoms today; no worsening symptoms & is in no apparent distress with a benign abdominal exam. Discussed use of OCPs as previously prescribed. I will prescribed generic sent to [facility] so they can utilize $4 med list. Patient agreeable to this plan and will follow up with Dr. Hulan Fray as scheduled.   Assessment and Plan  A: 1. Uterine leiomyoma, unspecified location   2. Generalized abdominal pain    P: Discharge home Rx ibuprofen suspension, tylenol suppositories, &  OCP Discussed reasons to return to MAU or ED Keep scheduled appt with Dr. Barrington Ellison 05/11/2016, 4:49 PM

## 2016-05-11 NOTE — Discharge Instructions (Signed)

## 2016-05-11 NOTE — MAU Note (Signed)
Pt reports having lower abd pain on her sides and her legs. This is the  3rd time she has had this this month and has come to MAU to be seen. Pt had period on 11/18. Bleeding has stopped but reports a little spotting today. Has an appointment in December with GYN. Pain too strong to day could not wait.

## 2016-05-18 ENCOUNTER — Encounter: Payer: Self-pay | Admitting: Obstetrics & Gynecology

## 2016-05-22 ENCOUNTER — Encounter: Payer: Self-pay | Admitting: Obstetrics & Gynecology

## 2016-05-22 ENCOUNTER — Ambulatory Visit (INDEPENDENT_AMBULATORY_CARE_PROVIDER_SITE_OTHER): Payer: Self-pay | Admitting: Obstetrics & Gynecology

## 2016-05-22 VITALS — BP 127/78 | HR 90 | Wt 119.0 lb

## 2016-05-22 DIAGNOSIS — G8929 Other chronic pain: Secondary | ICD-10-CM

## 2016-05-22 DIAGNOSIS — D251 Intramural leiomyoma of uterus: Secondary | ICD-10-CM

## 2016-05-22 DIAGNOSIS — Z0000 Encounter for general adult medical examination without abnormal findings: Secondary | ICD-10-CM

## 2016-05-22 DIAGNOSIS — D649 Anemia, unspecified: Secondary | ICD-10-CM

## 2016-05-22 DIAGNOSIS — R102 Pelvic and perineal pain: Secondary | ICD-10-CM

## 2016-05-22 DIAGNOSIS — Z6833 Body mass index (BMI) 33.0-33.9, adult: Secondary | ICD-10-CM

## 2016-05-22 DIAGNOSIS — E669 Obesity, unspecified: Secondary | ICD-10-CM | POA: Insufficient documentation

## 2016-05-22 DIAGNOSIS — E6609 Other obesity due to excess calories: Secondary | ICD-10-CM

## 2016-05-22 NOTE — Progress Notes (Addendum)
   Subjective:    Patient ID: Sally Williams, female    DOB: April 18, 1986, 30 y.o.   MRN: [id number]  HPI  30 yo M G0 from [place] here for follow up after starting OCPs for her heavy periods and CPP. Her u/s showed a 1.4 cm fibroid and a 4.3 cm fibroid. She says that her problems are unchanged.  Review of Systems  Her hbg was 10.2 10/17     Objective:   Physical Exam [id number] Breathing, conversing, and ambulating normally Abd- benign       Assessment & Plan:  She is requesting a check of her kidneys as she is taking IBU TID She declines a flu vaccine I have offered her a diagnostic laparoscopy. She will fill out the financial aid forms. I have sent [place] an email about this.

## 2016-05-23 LAB — BASIC METABOLIC PANEL
BUN: 12 mg/dL (ref 7–25)
CALCIUM: 9 mg/dL (ref 8.6–10.2)
CO2: 22 mmol/L (ref 20–31)
Chloride: 107 mmol/L (ref 98–110)
Creat: 0.65 mg/dL (ref 0.50–1.10)
Glucose, Bld: 116 mg/dL — ABNORMAL HIGH (ref 65–99)
POTASSIUM: 4.1 mmol/L (ref 3.5–5.3)
SODIUM: 138 mmol/L (ref 135–146)

## 2016-05-25 ENCOUNTER — Encounter (HOSPITAL_COMMUNITY): Payer: Self-pay | Admitting: *Deleted

## 2016-06-13 ENCOUNTER — Inpatient Hospital Stay (HOSPITAL_COMMUNITY)
Admission: AD | Admit: 2016-06-13 | Discharge: 2016-06-14 | Disposition: A | Discharge status: Home / Self Care | Payer: Self-pay | Source: Ambulatory Visit | Attending: Obstetrics & Gynecology | Admitting: Obstetrics & Gynecology

## 2016-06-13 ENCOUNTER — Inpatient Hospital Stay (HOSPITAL_COMMUNITY)
Admission: AD | Admit: 2016-06-13 | Discharge: 2016-06-13 | Disposition: A | Discharge status: Home / Self Care | Payer: Self-pay | Source: Ambulatory Visit | Attending: Obstetrics and Gynecology | Admitting: Obstetrics and Gynecology

## 2016-06-13 ENCOUNTER — Encounter (HOSPITAL_COMMUNITY): Payer: Self-pay

## 2016-06-13 ENCOUNTER — Inpatient Hospital Stay (HOSPITAL_COMMUNITY): Payer: Self-pay

## 2016-06-13 DIAGNOSIS — R1030 Lower abdominal pain, unspecified: Secondary | ICD-10-CM

## 2016-06-13 DIAGNOSIS — N946 Dysmenorrhea, unspecified: Secondary | ICD-10-CM | POA: Insufficient documentation

## 2016-06-13 DIAGNOSIS — N898 Other specified noninflammatory disorders of vagina: Secondary | ICD-10-CM | POA: Insufficient documentation

## 2016-06-13 DIAGNOSIS — Z79899 Other long term (current) drug therapy: Secondary | ICD-10-CM | POA: Insufficient documentation

## 2016-06-13 DIAGNOSIS — D259 Leiomyoma of uterus, unspecified: Secondary | ICD-10-CM | POA: Insufficient documentation

## 2016-06-13 DIAGNOSIS — Z86018 Personal history of other benign neoplasm: Secondary | ICD-10-CM

## 2016-06-13 DIAGNOSIS — R102 Pelvic and perineal pain: Secondary | ICD-10-CM | POA: Insufficient documentation

## 2016-06-13 LAB — COMPREHENSIVE METABOLIC PANEL
ALT: 23 U/L (ref 14–54)
ANION GAP: 10 (ref 5–15)
AST: 25 U/L (ref 15–41)
Albumin: 4 g/dL (ref 3.5–5.0)
Alkaline Phosphatase: 50 U/L (ref 38–126)
BILIRUBIN TOTAL: 0.1 mg/dL — AB (ref 0.3–1.2)
BUN: 11 mg/dL (ref 6–20)
CHLORIDE: 101 mmol/L (ref 101–111)
CO2: 23 mmol/L (ref 22–32)
Calcium: 9 mg/dL (ref 8.9–10.3)
Creatinine, Ser: 0.67 mg/dL (ref 0.44–1.00)
GFR calc Af Amer: >60 mL/min (ref >60)
Glucose, Bld: 106 mg/dL — ABNORMAL HIGH (ref 65–99)
POTASSIUM: 3.2 mmol/L — AB (ref 3.5–5.1)
Sodium: 134 mmol/L — ABNORMAL LOW (ref 135–145)
TOTAL PROTEIN: 7.6 g/dL (ref 6.5–8.1)

## 2016-06-13 LAB — URINALYSIS, ROUTINE W REFLEX MICROSCOPIC
BILIRUBIN URINE: NEGATIVE
Bacteria, UA: NONE SEEN
Glucose, UA: NEGATIVE mg/dL
KETONES UR: NEGATIVE mg/dL
LEUKOCYTES UA: NEGATIVE
Nitrite: NEGATIVE
PH: 5 (ref 5.0–8.0)
Protein, ur: NEGATIVE mg/dL
SPECIFIC GRAVITY, URINE: 1.042 — AB (ref 1.005–1.030)

## 2016-06-13 LAB — POCT PREGNANCY, URINE: PREG TEST UR: NEGATIVE

## 2016-06-13 LAB — CBC
HEMATOCRIT: 31.4 % — AB (ref 36.0–46.0)
Hemoglobin: 10.7 g/dL — ABNORMAL LOW (ref 12.0–15.0)
MCH: 22.9 pg — ABNORMAL LOW (ref 26.0–34.0)
MCHC: 34.1 g/dL (ref 30.0–36.0)
MCV: 67.2 fL — AB (ref 78.0–100.0)
PLATELETS: 346 10*3/uL (ref 150–400)
RBC: 4.67 MIL/uL (ref 3.87–5.11)
RDW: 18.8 % — AB (ref 11.5–15.5)
WBC: 15.7 10*3/uL — ABNORMAL HIGH (ref 4.0–10.5)

## 2016-06-13 MED ORDER — HYDROMORPHONE HCL 1 MG/ML IJ SOLN
1.0000 mg | Freq: Once | INTRAMUSCULAR | Status: AC
  Administered 2016-06-13: 1 mg via INTRAMUSCULAR
  Filled 2016-06-13: qty 1

## 2016-06-13 MED ORDER — KETOROLAC TROMETHAMINE 60 MG/2ML IM SOLN
60.0000 mg | Freq: Once | INTRAMUSCULAR | Status: AC
  Administered 2016-06-13: 60 mg via INTRAMUSCULAR
  Filled 2016-06-13: qty 2

## 2016-06-13 MED ORDER — PROMETHAZINE HCL 25 MG/ML IJ SOLN
25.0000 mg | Freq: Once | INTRAMUSCULAR | Status: AC
  Administered 2016-06-13: 25 mg via INTRAMUSCULAR
  Filled 2016-06-13: qty 1

## 2016-06-13 MED ORDER — KETOROLAC TROMETHAMINE 60 MG/2ML IM SOLN: 60.0000 mg | Freq: Once | INTRAMUSCULAR | Status: DC

## 2016-06-13 MED ORDER — TRAMADOL HCL 50 MG PO TABS: 50.0000 mg | ORAL_TABLET | Freq: Four times a day (QID) | ORAL | 0 refills | Status: DC | PRN

## 2016-06-13 NOTE — MAU Provider Note (Signed)
History     CSN: [id number]  Arrival date and time: 06/13/16 0025   First Provider Initiated Contact with Patient 06/13/16 0105      Chief Complaint  Patient presents with  . Pelvic Pain   Sally Williams is a 31 y.o. G0P0000 who presents today with abdominal pain. This is her 5th visit to MAU with this complaint, and she has been seen in the clinic x 2 as well. Her husband reports that he completed the application for medical assistance on 12/26, and they are waiting for that to proceed with surgery.    Pelvic Pain  The patient's primary symptoms include pelvic pain and vaginal bleeding. This is a new problem. The current episode started more than 1 month ago. The problem occurs intermittently. The problem has been waxing and waning. Pain severity now: 10/10  The problem affects both sides. She is not pregnant. The vaginal discharge was normal. Nothing aggravates the symptoms. She has tried oral narcotics (OCPs ) for the symptoms. The treatment provided no relief. She uses oral contraceptives for contraception. Her menstrual history has been regular.   Past Medical History:  Diagnosis Date  . Dysmenorrhea   . Fibroid   . Headache   . Spinal headache   . Ulcer [facility])     Past Surgical History:  Procedure Laterality Date  . NO PAST SURGERIES      No family history on file.  Social History  Substance Use Topics  . Smoking status: Never Smoker  . Smokeless tobacco: Never Used  . Alcohol use No    Allergies: No Known Allergies  Prescriptions Prior to Admission  Medication Sig Dispense Refill Last Dose  . norgestimate-ethinyl estradiol (ORTHO-CYCLEN,SPRINTEC,PREVIFEM) 0.25-35 MG-MCG tablet Take 1 tablet by mouth daily. 1 Package 2 Past Month at 06/04/2016  . traMADol (ULTRAM) 50 MG tablet Take 50 mg by mouth every 6 (six) hours as needed for moderate pain.   06/12/2016 at 2300  . acetaminophen (TYLENOL) 500 MG tablet Take 500 mg by mouth every 6 (six) hours as needed for mild  pain, moderate pain or headache.   Not Taking  . acetaminophen (TYLENOL) 650 MG suppository Place 1 suppository (650 mg total) rectally every 4 (four) hours as needed. (Patient not taking: Reported on 05/22/2016) 12 suppository 0 Not Taking  . acetaminophen (TYLENOL) 80 MG suppository Place 80 mg rectally every 4 (four) hours as needed for mild pain.   Not Taking  . calcium carbonate (TUMS - DOSED IN MG ELEMENTAL CALCIUM) 500 MG chewable tablet Chew 1 tablet by mouth as needed for indigestion or heartburn.   Not Taking  . ferrous sulfate 325 (65 FE) MG tablet Take 325 mg by mouth daily with breakfast.   Not Taking  . ibuprofen (ADVIL,MOTRIN) 100 MG/5ML suspension Take 20 mLs (400 mg total) by mouth every 6 (six) hours as needed. 473 mL 1 Taking  . Multiple Vitamin (MULTIVITAMIN WITH MINERALS) TABS tablet Take 1 tablet by mouth daily.   Taking    Review of Systems  Genitourinary: Positive for pelvic pain.   Physical Exam   Blood pressure 152/78, pulse 80, temperature 98.4 F (36.9 C), temperature source Oral, resp. rate 22.  Physical Exam  Nursing note and vitals reviewed. Constitutional: She is oriented to person, place, and time. She appears well-developed and well-nourished. She appears distressed (writhing in th bed. ).  HENT:  Head: Normocephalic.  Cardiovascular: Normal rate.   Respiratory: Effort normal.  GI: Soft. There is no tenderness.  There is no rebound.  Neurological: She is alert and oriented to person, place, and time.  Skin: Skin is warm and dry.  Psychiatric: She has a normal mood and affect.   Results for orders placed or performed during the hospital encounter of 06/13/16 (from the past 24 hour(s))  Urinalysis, Routine w reflex microscopic     Status: Abnormal   Collection Time: 06/13/16 12:27 AM  Result Value Ref Range   Color, Urine YELLOW YELLOW   APPearance HAZY (A) CLEAR   Specific Gravity, Urine 1.042 (H) 1.005 - 1.030   pH 5.0 5.0 - 8.0   Glucose, UA  NEGATIVE NEGATIVE mg/dL   Hgb urine dipstick MODERATE (A) NEGATIVE   Bilirubin Urine NEGATIVE NEGATIVE   Ketones, ur NEGATIVE NEGATIVE mg/dL   Protein, ur NEGATIVE NEGATIVE mg/dL   Nitrite NEGATIVE NEGATIVE   Leukocytes, UA NEGATIVE NEGATIVE   RBC / HPF 6-30 0 - 5 RBC/hpf   WBC, UA 0-5 0 - 5 WBC/hpf   Bacteria, UA NONE SEEN NONE SEEN   Squamous Epithelial / LPF 0-5 (A) NONE SEEN   Mucous PRESENT    Ca Oxalate Crys, UA PRESENT   Pregnancy, urine POC     Status: None   Collection Time: 06/13/16 12:37 AM  Result Value Ref Range   Preg Test, Ur NEGATIVE NEGATIVE  CBC     Status: Abnormal   Collection Time: 06/13/16  1:26 AM  Result Value Ref Range   WBC 15.7 (H) 4.0 - 10.5 K/uL   RBC 4.67 3.87 - 5.11 MIL/uL   Hemoglobin 10.7 (L) 12.0 - 15.0 g/dL   HCT 31.4 (L) 36.0 - 46.0 %   MCV 67.2 (L) 78.0 - 100.0 fL   MCH 22.9 (L) 26.0 - 34.0 pg   MCHC 34.1 30.0 - 36.0 g/dL   RDW 18.8 (H) 11.5 - 15.5 %   Platelets 346 150 - 400 K/uL  Comprehensive metabolic panel     Status: Abnormal   Collection Time: 06/13/16  1:26 AM  Result Value Ref Range   Sodium 134 (L) 135 - 145 mmol/L   Potassium 3.2 (L) 3.5 - 5.1 mmol/L   Chloride 101 101 - 111 mmol/L   CO2 23 22 - 32 mmol/L   Glucose, Bld 106 (H) 65 - 99 mg/dL   BUN 11 6 - 20 mg/dL   Creatinine, Ser 0.67 0.44 - 1.00 mg/dL   Calcium 9.0 8.9 - 10.3 mg/dL   Total Protein 7.6 6.5 - 8.1 g/dL   Albumin 4.0 3.5 - 5.0 g/dL   AST 25 15 - 41 U/L   ALT 23 14 - 54 U/L   Alkaline Phosphatase 50 38 - 126 U/L   Total Bilirubin 0.1 (L) 0.3 - 1.2 mg/dL   GFR calc non Af Amer >60 >60 mL/min   GFR calc Af Amer >60 >60 mL/min   Anion gap 10 5 - 15   US Transvaginal Non-ob  Result Date: 06/13/2016 CLINICAL DATA:  Pelvic pain for 2 months.  Vaginal bleeding. EXAM: ULTRASOUND PELVIS TRANSVAGINAL TECHNIQUE: Transvaginal ultrasound examination of the pelvis was performed including evaluation of the uterus, ovaries, adnexal regions, and pelvic cul-de-sac.  COMPARISON:  04/14/2016 FINDINGS: Uterus Measurements: 5.7 x 7.8 x 9.7 cm. Multiple fibroids, unchanged from 04/14/2016, measuring up to 4.5 cm. Endometrium Thickness: 12 mm.  No focal abnormality visualized. Right ovary Measurements: 1.8 x 3.2 by 1.8 cm. Normal appearance/no adnexal mass. Left ovary Measurements: 2.1 x 2.9 by 1.6 cm. Normal appearance/no  adnexal mass. Other findings:  Scant volume free pelvic fluid IMPRESSION: Multi fibroid uterus.  Normal ovaries. Electronically Signed   By: Andreas Newport M.D.   On: 06/13/2016 03:12   MAU Course  Procedures  MDM Patient has had dilaudid. She reports that pain has improved, but she is still requesting something else for pain at this time.  Assessment and Plan   1. Pelvic pain    DC home Comfort measures reviewed  Bleeding precautions RX: tramadol 50mg  PRN #[id number]  Return to MAU as needed   Follow-up [place], MD Follow up.   Specialty:  Obstetrics and Gynecology Why:  call to follow-up on when your surgery may be scheduled.  Contact information: [place] [address] [phone]            Mathis Bud 06/13/2016, 1:06 AM

## 2016-06-13 NOTE — MAU Note (Signed)
Patient presents with c/o abdominal pain that that is constant and all over. Patient is also having some vaginal bleeding.

## 2016-06-13 NOTE — MAU Note (Signed)
PT  SAYS  LMP WAS 12-24   THEN STARTED  BLEEDING  AGAIN  ON 1-1.  TOOK XS TYLENOL  2 TABS  AT 1030PM.  THEN  TOOK  TRAMADOL 1 TAB  AT  2330.

## 2016-06-13 NOTE — Discharge Instructions (Signed)
Pelvic Pain, Female Pelvic pain is pain felt below the belly button and between your hips. It can be caused by many different things. It is important to get help right away. This is especially true for severe, sharp, or unusual pain that comes on suddenly.  HOME CARE  Only take medicine as told by your doctor.  Rest as told by your doctor.  Eat a healthy diet, such as fruits, vegetables, and lean meats.  Drink enough fluids to keep your pee (urine) clear or pale yellow, or as told.  Avoid sex (intercourse) if it causes pain.  Apply warm or cold packs to your lower belly (abdomen). Use the type of pack that helps the pain.  Avoid situations that cause you stress.  Keep a journal to track your pain. Write down:  When the pain started.  Where it is located.  If there are things that seem to be related to the pain, such as food or your period.  Follow up with your doctor as told. GET HELP RIGHT AWAY IF:   You have heavy bleeding from the vagina.  You have more pelvic pain.  You feel lightheaded or pass out (faint).  You have chills.  You have pain when you pee or have blood in your pee.  You cannot stop having watery poop (diarrhea).  You cannot stop throwing up (vomiting).  You have a fever or lasting symptoms for more than 3 days.  You have a fever and your symptoms suddenly get worse.  You are being physically or sexually abused.  Your medicine does not help your pain.  You have fluid (discharge) coming from your vagina that is not normal. MAKE SURE YOU:  Understand these instructions.  Will watch your condition.  Will get help if you are not doing well or get worse. This information is not intended to replace advice given to you by your health care provider. Make sure you discuss any questions you have with your health care provider. Document Released: 11/15/2007 Document Revised: 06/19/2014 Document Reviewed: 03/19/2015 Elsevier Interactive Patient  Education  2017 [employer].

## 2016-06-13 NOTE — MAU Note (Signed)
Presents with abdominal pain mostly on the Left side, recently all over abdomen.  Reports history of fibroids that she frequently reports to the ED for treatment.  No N/V/D.  Last BM today X4.  Reports bleeding that began 06/04/16.

## 2016-06-13 NOTE — MAU Provider Note (Signed)
History     CSN: [id number]  Arrival date and time: 06/13/16 2235   First Provider Initiated Contact with Patient 06/13/16 2309      Chief Complaint  Patient presents with  . Abdominal Pain   Non-pregnant female here with abdominal pain. She describes as burning and constant and feels in upper and lower abdomen. She denies fever. She denies urinary sx. She reports a small amount vaginal bleeding. She has been seen multiple times in MAU for similar sx. She was given Ultram yesterday but states it doesn't help. She has hx of chronic pelvic pain and uterine fibroids and was given OCPs but stopped taking them. Her spouse reports trying to schedule an appt in [place] for f/u but cannot get one. She also has hx of peptic ulcer and pain d/t this per spouse. TVUS yesterday revealed multiple fibroid uterus otherwise nml.    Past Medical History:  Diagnosis Date  . Dysmenorrhea   . Fibroid   . Headache   . Spinal headache   . Ulcer [facility])     Past Surgical History:  Procedure Laterality Date  . NO PAST SURGERIES      History reviewed. No pertinent family history.  Social History  Substance Use Topics  . Smoking status: Never Smoker  . Smokeless tobacco: Never Used  . Alcohol use No    Allergies: No Known Allergies  Prescriptions Prior to Admission  Medication Sig Dispense Refill Last Dose  . acetaminophen (TYLENOL) 500 MG tablet Take 500 mg by mouth every 6 (six) hours as needed for mild pain, moderate pain or headache.   Not Taking  . acetaminophen (TYLENOL) 650 MG suppository Place 1 suppository (650 mg total) rectally every 4 (four) hours as needed. (Patient not taking: Reported on 05/22/2016) 12 suppository 0 Not Taking  . acetaminophen (TYLENOL) 80 MG suppository Place 80 mg rectally every 4 (four) hours as needed for mild pain.   Not Taking  . calcium carbonate (TUMS - DOSED IN MG ELEMENTAL CALCIUM) 500 MG chewable tablet Chew 1 tablet by mouth as needed for indigestion or  heartburn.   Not Taking  . ferrous sulfate 325 (65 FE) MG tablet Take 325 mg by mouth daily with breakfast.   Not Taking  . ibuprofen (ADVIL,MOTRIN) 100 MG/5ML suspension Take 20 mLs (400 mg total) by mouth every 6 (six) hours as needed. 473 mL 1 Taking  . Multiple Vitamin (MULTIVITAMIN WITH MINERALS) TABS tablet Take 1 tablet by mouth daily.   Taking  . norgestimate-ethinyl estradiol (ORTHO-CYCLEN,SPRINTEC,PREVIFEM) 0.25-35 MG-MCG tablet Take 1 tablet by mouth daily. 1 Package 2 Past Month at 06/04/2016  . traMADol (ULTRAM) 50 MG tablet Take 50 mg by mouth every 6 (six) hours as needed for moderate pain.   06/12/2016 at 2300  . traMADol (ULTRAM) 50 MG tablet Take 1 tablet (50 mg total) by mouth every 6 (six) hours as needed. 15 tablet 0     Review of Systems  Constitutional: Negative.  Negative for chills and fever.  Gastrointestinal: Positive for abdominal pain. Negative for constipation, diarrhea, nausea and vomiting.  Genitourinary: Negative.    Physical Exam   Blood pressure 130/60, pulse 74, temperature 97.5 F (36.4 C), temperature source Oral, resp. rate 20.  Physical Exam  Constitutional: She is oriented to person, place, and time. She appears well-developed and well-nourished. She appears distressed (writhing in bed, on hands and knees).  HENT:  Head: Normocephalic and atraumatic.  Neck: Normal range of motion. Neck supple.  Cardiovascular:  Normal rate.   Respiratory: Effort normal.  GI: Soft. She exhibits no distension and no mass. There is generalized tenderness. There is no rebound and no guarding.  Genitourinary:  Genitourinary Comments: External: no lesions or erythema Vagina: rugated, nulli, small drk red bloody discharge Uterus: non enlarged, anteverted, non tender, no CMT Adnexae: no masses, no tenderness left, no tenderness right   Musculoskeletal: Normal range of motion.  Neurological: She is alert and oriented to person, place, and time.  Skin: Skin is warm and  dry.  Psychiatric: She has a normal mood and affect.   Results for orders placed or performed during the hospital encounter of 06/13/16 (from the past 72 hour(s))  Wet prep, genital     Status: Abnormal   Collection Time: 06/14/16 12:30 AM  Result Value Ref Range   Yeast Wet Prep HPF POC NONE SEEN NONE SEEN   Trich, Wet Prep NONE SEEN NONE SEEN   Clue Cells Wet Prep HPF POC NONE SEEN NONE SEEN   WBC, Wet Prep HPF POC FEW (A) NONE SEEN    Comment: FEW BACTERIA SEEN Specimen diluted due to transport tube containing more than 1 ml of saline, interpret results with caution.    Sperm NONE SEEN    MAU Course  Procedures Dilaudid IM Phenergan IM  MDM Labs ordered and reviewed. No evidence of acute abdominal or pelvic process. Pain improved immediately after medication. Stable for discharge home.  Assessment and Plan   1. Lower abdominal pain   2. History of uterine fibroid    Discharge home Follow up in [facility] will call tomorrow Heating pad prn comfort Return to MAU for OBGYN emergencies  Allergies as of 06/14/2016   No Known Allergies     Medication List    STOP taking these medications   acetaminophen 500 MG tablet Commonly known as:  TYLENOL   acetaminophen 650 MG suppository Commonly known as:  TYLENOL   acetaminophen 80 MG suppository Commonly known as:  TYLENOL     TAKE these medications   calcium carbonate 500 MG chewable tablet Commonly known as:  TUMS - dosed in mg elemental calcium Chew 1 tablet by mouth as needed for indigestion or heartburn.   ferrous sulfate 325 (65 FE) MG tablet Take 325 mg by mouth daily with breakfast.   ibuprofen 100 MG/5ML suspension Commonly known as:  ADVIL,MOTRIN Take 20 mLs (400 mg total) by mouth every 6 (six) hours as needed.   multivitamin with minerals Tabs tablet Take 1 tablet by mouth daily.   norgestimate-ethinyl estradiol 0.25-35 MG-MCG tablet Commonly known as:  ORTHO-CYCLEN,SPRINTEC,PREVIFEM Take 1 tablet  by mouth daily.   oxyCODONE-acetaminophen 5-325 MG tablet Commonly known as:  PERCOCET/ROXICET Take 1 tablet by mouth every 4 (four) hours as needed for severe pain.   traMADol 50 MG tablet Commonly known as:  ULTRAM Take 1 tablet (50 mg total) by mouth every 6 (six) hours as needed. What changed:  Another medication with the same name was removed. Continue taking this medication, and follow the directions you see here.      Julianne Handler, CNM 06/13/2016, 11:16 PM

## 2016-06-14 DIAGNOSIS — R1030 Lower abdominal pain, unspecified: Secondary | ICD-10-CM

## 2016-06-14 DIAGNOSIS — Z86018 Personal history of other benign neoplasm: Secondary | ICD-10-CM

## 2016-06-14 LAB — WET PREP, GENITAL
Clue Cells Wet Prep HPF POC: NONE SEEN
SPERM: NONE SEEN
TRICH WET PREP: NONE SEEN
Yeast Wet Prep HPF POC: NONE SEEN

## 2016-06-14 MED ORDER — OXYCODONE-ACETAMINOPHEN 5-325 MG PO TABS
1.0000 | ORAL_TABLET | ORAL | Status: DC | PRN
  Administered 2016-06-14: 1 via ORAL
  Filled 2016-06-14: qty 1

## 2016-06-14 MED ORDER — OXYCODONE-ACETAMINOPHEN 5-325 MG PO TABS: 1.0000 | ORAL_TABLET | ORAL | 0 refills | Status: DC | PRN

## 2016-06-14 NOTE — Discharge Instructions (Signed)
Uterine Fibroids Uterine fibroids are tissue masses (tumors). They are also called leiomyomas. They can develop inside of a woman's womb (uterus). They can grow very large. Fibroids are not cancerous (benign). Most fibroids do not require medical treatment. Follow these instructions at home:  Keep all follow-up visits as told by your doctor. This is important.  Take medicines only as told by your doctor.  If you were prescribed a hormone treatment, take the hormone medicines exactly as told.  Do not take aspirin. It can cause bleeding.  Ask your doctor about taking iron pills and increasing the amount of dark green, leafy vegetables in your diet. These actions can help to boost your blood iron levels.  Pay close attention to your period. Tell your doctor about any changes, such as:  Increased blood flow. This may require you to use more pads or tampons than usual per month.  A change in the number of days that your period lasts per month.  A change in symptoms that come with your period, such as back pain or cramping in your belly area (abdomen). Contact a doctor if:  You have pain in your back or the area between your hip bones (pelvic area) that is not controlled by medicines.  You have pain in your abdomen that is not controlled with medicines.  You have an increase in bleeding between and during periods.  You soak tampons or pads in a half hour or less.  You feel lightheaded.  You feel extra tired.  You feel weak. Get help right away if:  You pass out (faint).  You have a sudden increase in pelvic pain. This information is not intended to replace advice given to you by your health care provider. Make sure you discuss any questions you have with your health care provider. Document Released: 07/01/2010 Document Revised: 01/28/2016 Document Reviewed: 11/25/2013 Elsevier Interactive Patient Education  2017 Elsevier Inc.  

## 2016-07-07 ENCOUNTER — Encounter: Payer: Self-pay | Admitting: Obstetrics & Gynecology

## 2016-07-07 ENCOUNTER — Ambulatory Visit (INDEPENDENT_AMBULATORY_CARE_PROVIDER_SITE_OTHER): Payer: Self-pay | Admitting: Obstetrics & Gynecology

## 2016-07-07 VITALS — BP 99/76 | HR 68 | Wt 109.6 lb

## 2016-07-07 DIAGNOSIS — R102 Pelvic and perineal pain: Secondary | ICD-10-CM

## 2016-07-07 NOTE — Progress Notes (Signed)
   Subjective:    Patient ID: Sally Williams, female    DOB: 1985/11/14, 31 y.o.   MRN: [id number]  HPI 31 yo M G0 from [place] here for MAU follow up. She has been in the MAU 5 times and this is her 3rd clinic visit for various different locations of pelvic pain. Her u/s showed a small fibroid and she is scheduled for a diagnostic laparoscopy 07-28-15. She started taking 2 meds that she obtained in [place]. These are supposed to stop her period. She says that her pain is better today   Review of Systems     Objective:   Physical Exam [facility] Breathing, conversing, and ambulating normally Abd- benign      Assessment & Plan:  Severe and varying pelvic pains- I will order a CT and plan to do her surgery 2015-18.

## 2016-07-07 NOTE — Progress Notes (Signed)
CT Scan scheduled for January 30th @ 1000.  Pt notified.

## 2016-07-11 ENCOUNTER — Ambulatory Visit (HOSPITAL_COMMUNITY)
Admission: RE | Admit: 2016-07-11 | Discharge: 2016-07-11 | Disposition: A | Discharge status: Home / Self Care | Payer: Self-pay | Source: Ambulatory Visit | Attending: Obstetrics & Gynecology | Admitting: Obstetrics & Gynecology

## 2016-07-11 ENCOUNTER — Telehealth: Payer: Self-pay

## 2016-07-11 DIAGNOSIS — R102 Pelvic and perineal pain: Secondary | ICD-10-CM | POA: Insufficient documentation

## 2016-07-11 DIAGNOSIS — D259 Leiomyoma of uterus, unspecified: Secondary | ICD-10-CM | POA: Insufficient documentation

## 2016-07-11 MED ORDER — IOPAMIDOL (ISOVUE-300) INJECTION 61%
100.0000 mL | Freq: Once | INTRAVENOUS | Status: AC | PRN
  Administered 2016-07-11: 100 mL via INTRAVENOUS

## 2016-07-11 NOTE — Telephone Encounter (Signed)
The ultrasound results have been enter correctly

## 2016-07-19 NOTE — Patient Instructions (Signed)
Your procedure is scheduled on:  Thursday, Feb. 15, 2018  Enter through the [employer] [facility] at:  7:45 AM  Pick up the phone at the desk and dial [phone].  Call this number if you have problems the morning of surgery: [phone].  Remember: Do NOT eat food or drink after:  Midnight Wednesday  Take these medicines the morning of surgery with a SIP OF WATER:  None  Stop ALL herbal medications at this time  Do NOT smoke the day of surgery.  Do NOT wear jewelry (body piercing), metal hair clips/bobby pins, make-up, or nail polish. Do NOT wear lotions, powders, or perfumes.  You may wear deodorant. Do NOT shave for 48 hours prior to surgery. Do NOT bring valuables to the hospital. Contacts, dentures, or bridgework may not be worn into surgery.  Have a responsible adult drive you home and stay with you for 24 hours after your procedure  Bring a copy of your healthcare power of attorney and living will documents.  **Effective Friday, Jan. 12, 2018, [place] will implement no hospital visitations from children age 49 and younger due to a steady increase in flu activity in our community and hospitals. **

## 2016-07-20 ENCOUNTER — Encounter (HOSPITAL_COMMUNITY): Payer: Self-pay

## 2016-07-20 ENCOUNTER — Encounter (HOSPITAL_COMMUNITY)
Admission: RE | Admit: 2016-07-20 | Discharge: 2016-07-20 | Disposition: A | Discharge status: Home / Self Care | Payer: Self-pay | Source: Ambulatory Visit | Attending: Obstetrics & Gynecology | Admitting: Obstetrics & Gynecology

## 2016-07-20 DIAGNOSIS — D259 Leiomyoma of uterus, unspecified: Secondary | ICD-10-CM | POA: Insufficient documentation

## 2016-07-20 DIAGNOSIS — R102 Pelvic and perineal pain: Secondary | ICD-10-CM | POA: Insufficient documentation

## 2016-07-20 DIAGNOSIS — Z01812 Encounter for preprocedural laboratory examination: Secondary | ICD-10-CM | POA: Insufficient documentation

## 2016-07-20 HISTORY — DX: Gastro-esophageal reflux disease without esophagitis: K21.9

## 2016-07-20 LAB — CBC
HCT: 33.3 % — ABNORMAL LOW (ref 36.0–46.0)
Hemoglobin: 10.8 g/dL — ABNORMAL LOW (ref 12.0–15.0)
MCH: 22.4 pg — AB (ref 26.0–34.0)
MCHC: 32.4 g/dL (ref 30.0–36.0)
MCV: 68.9 fL — ABNORMAL LOW (ref 78.0–100.0)
PLATELETS: 258 10*3/uL (ref 150–400)
RBC: 4.83 MIL/uL (ref 3.87–5.11)
RDW: 16.6 % — AB (ref 11.5–15.5)
WBC: 7 10*3/uL (ref 4.0–10.5)

## 2016-07-26 NOTE — Anesthesia Preprocedure Evaluation (Addendum)
Anesthesia Evaluation  Patient identified by MRN, date of birth, ID band Patient awake    Reviewed: Allergy & Precautions, NPO status , Patient's Chart, lab work & pertinent test results  History of Anesthesia Complications (+) POST - OP SPINAL HEADACHE  Airway Mallampati: II  TM Distance: >3 FB Neck ROM: Full    Dental  (+) Dental Advisory Given   Pulmonary neg pulmonary ROS,    breath sounds clear to auscultation       Cardiovascular negative cardio ROS   Rhythm:Regular Rate:Normal     Neuro/Psych negative neurological ROS     GI/Hepatic Neg liver ROS, GERD  ,  Endo/Other  negative endocrine ROS  Renal/GU negative Renal ROS     Musculoskeletal   Abdominal   Peds  Hematology  (+) anemia ,   Anesthesia Other Findings   Reproductive/Obstetrics                            Lab Results  Component Value Date   WBC 7.0 07/20/2016   HGB 10.8 (L) 07/20/2016   HCT 33.3 (L) 07/20/2016   MCV 68.9 (L) 07/20/2016   PLT 258 07/20/2016   Lab Results  Component Value Date   CREATININE 0.67 06/13/2016   BUN 11 06/13/2016   NA 134 (L) 06/13/2016   K 3.2 (L) 06/13/2016   CL 101 06/13/2016   CO2 23 06/13/2016    Anesthesia Physical Anesthesia Plan  ASA: II  Anesthesia Plan: General   Post-op Pain Management:    Induction: Intravenous  Airway Management Planned: Oral ETT  Additional Equipment:   Intra-op Plan:   Post-operative Plan: Extubation in OR  Informed Consent: I have reviewed the patients History and Physical, chart, labs and discussed the procedure including the risks, benefits and alternatives for the proposed anesthesia with the patient or authorized representative who has indicated his/her understanding and acceptance.   Dental advisory given  Plan Discussed with: CRNA  Anesthesia Plan Comments:        Anesthesia Quick Evaluation

## 2016-07-27 ENCOUNTER — Encounter (HOSPITAL_COMMUNITY)
Admission: RE | Disposition: A | Discharge status: Home / Self Care | Payer: Self-pay | Source: Ambulatory Visit | Attending: Obstetrics & Gynecology

## 2016-07-27 ENCOUNTER — Encounter (HOSPITAL_COMMUNITY): Payer: Self-pay | Admitting: *Deleted

## 2016-07-27 ENCOUNTER — Ambulatory Visit (HOSPITAL_COMMUNITY): Payer: Self-pay | Admitting: Anesthesiology

## 2016-07-27 ENCOUNTER — Ambulatory Visit (HOSPITAL_COMMUNITY)
Admission: RE | Admit: 2016-07-27 | Discharge: 2016-07-27 | Disposition: A | Discharge status: Home / Self Care | Payer: Self-pay | Source: Ambulatory Visit | Attending: Obstetrics & Gynecology | Admitting: Obstetrics & Gynecology

## 2016-07-27 DIAGNOSIS — N803 Endometriosis of pelvic peritoneum: Secondary | ICD-10-CM | POA: Insufficient documentation

## 2016-07-27 DIAGNOSIS — D259 Leiomyoma of uterus, unspecified: Secondary | ICD-10-CM | POA: Insufficient documentation

## 2016-07-27 DIAGNOSIS — G8929 Other chronic pain: Secondary | ICD-10-CM | POA: Insufficient documentation

## 2016-07-27 DIAGNOSIS — R102 Pelvic and perineal pain: Secondary | ICD-10-CM | POA: Insufficient documentation

## 2016-07-27 DIAGNOSIS — N946 Dysmenorrhea, unspecified: Secondary | ICD-10-CM | POA: Insufficient documentation

## 2016-07-27 HISTORY — PX: LAPAROSCOPY: SHX197

## 2016-07-27 LAB — PREGNANCY, URINE: Preg Test, Ur: NEGATIVE

## 2016-07-27 SURGERY — LAPAROSCOPY, DIAGNOSTIC
Anesthesia: General | Site: Abdomen

## 2016-07-27 MED ORDER — NEOSTIGMINE METHYLSULFATE 10 MG/10ML IV SOLN
INTRAVENOUS | Status: DC | PRN
  Administered 2016-07-27: 2 mg via INTRAVENOUS

## 2016-07-27 MED ORDER — SCOPOLAMINE 1 MG/3DAYS TD PT72
1.0000 | MEDICATED_PATCH | Freq: Once | TRANSDERMAL | Status: DC
  Administered 2016-07-27: 1.5 mg via TRANSDERMAL

## 2016-07-27 MED ORDER — MIDAZOLAM HCL 2 MG/2ML IJ SOLN
INTRAMUSCULAR | Status: DC | PRN
  Administered 2016-07-27: 1 mg via INTRAVENOUS

## 2016-07-27 MED ORDER — FENTANYL CITRATE (PF) 250 MCG/5ML IJ SOLN
INTRAMUSCULAR | Status: AC
  Filled 2016-07-27: qty 5

## 2016-07-27 MED ORDER — PROPOFOL 10 MG/ML IV BOLUS
INTRAVENOUS | Status: DC | PRN
  Administered 2016-07-27: 150 mg via INTRAVENOUS

## 2016-07-27 MED ORDER — DEXAMETHASONE SODIUM PHOSPHATE 10 MG/ML IJ SOLN
INTRAMUSCULAR | Status: DC | PRN
  Administered 2016-07-27: 4 mg via INTRAVENOUS

## 2016-07-27 MED ORDER — NEOSTIGMINE METHYLSULFATE 10 MG/10ML IV SOLN
INTRAVENOUS | Status: AC
  Filled 2016-07-27: qty 1

## 2016-07-27 MED ORDER — GLYCOPYRROLATE 0.2 MG/ML IJ SOLN
INTRAMUSCULAR | Status: DC | PRN
  Administered 2016-07-27: 0.4 mg via INTRAVENOUS

## 2016-07-27 MED ORDER — PROPOFOL 10 MG/ML IV BOLUS
INTRAVENOUS | Status: AC
  Filled 2016-07-27: qty 20

## 2016-07-27 MED ORDER — SCOPOLAMINE 1 MG/3DAYS TD PT72
MEDICATED_PATCH | TRANSDERMAL | Status: AC
  Administered 2016-07-27: 1.5 mg via TRANSDERMAL
  Filled 2016-07-27: qty 1

## 2016-07-27 MED ORDER — KETOROLAC TROMETHAMINE 30 MG/ML IJ SOLN
INTRAMUSCULAR | Status: AC
Start: 2016-07-27 — End: 2016-07-27
  Filled 2016-07-27: qty 1

## 2016-07-27 MED ORDER — LIDOCAINE HCL (CARDIAC) 20 MG/ML IV SOLN
INTRAVENOUS | Status: DC | PRN
  Administered 2016-07-27: 50 mg via INTRAVENOUS

## 2016-07-27 MED ORDER — BUPIVACAINE HCL (PF) 0.5 % IJ SOLN
INTRAMUSCULAR | Status: AC
  Filled 2016-07-27: qty 30

## 2016-07-27 MED ORDER — KETOROLAC TROMETHAMINE 30 MG/ML IJ SOLN
INTRAMUSCULAR | Status: DC | PRN
  Administered 2016-07-27: 30 mg via INTRAVENOUS

## 2016-07-27 MED ORDER — OXYCODONE-ACETAMINOPHEN 5-325 MG PO TABS: 1.0000 | ORAL_TABLET | Freq: Four times a day (QID) | ORAL | 0 refills | Status: DC | PRN

## 2016-07-27 MED ORDER — PHENYLEPHRINE 40 MCG/ML (10ML) SYRINGE FOR IV PUSH (FOR BLOOD PRESSURE SUPPORT)
PREFILLED_SYRINGE | INTRAVENOUS | Status: AC
  Filled 2016-07-27: qty 10

## 2016-07-27 MED ORDER — GLYCOPYRROLATE 0.2 MG/ML IJ SOLN
INTRAMUSCULAR | Status: AC
  Filled 2016-07-27: qty 2

## 2016-07-27 MED ORDER — MIDAZOLAM HCL 2 MG/2ML IJ SOLN
INTRAMUSCULAR | Status: AC
  Filled 2016-07-27: qty 2

## 2016-07-27 MED ORDER — ROCURONIUM BROMIDE 100 MG/10ML IV SOLN
INTRAVENOUS | Status: DC | PRN
  Administered 2016-07-27: 25 mg via INTRAVENOUS

## 2016-07-27 MED ORDER — ONDANSETRON HCL 4 MG/2ML IJ SOLN
INTRAMUSCULAR | Status: AC
  Filled 2016-07-27: qty 2

## 2016-07-27 MED ORDER — LACTATED RINGERS IV SOLN
INTRAVENOUS | Status: DC
  Administered 2016-07-27: 09:00:00 via INTRAVENOUS

## 2016-07-27 MED ORDER — HYDROMORPHONE HCL 1 MG/ML IJ SOLN: 0.2500 mg | INTRAMUSCULAR | Status: DC | PRN

## 2016-07-27 MED ORDER — PROMETHAZINE HCL 25 MG/ML IJ SOLN: 6.2500 mg | INTRAMUSCULAR | Status: DC | PRN

## 2016-07-27 MED ORDER — BUPIVACAINE HCL (PF) 0.5 % IJ SOLN
INTRAMUSCULAR | Status: DC | PRN
  Administered 2016-07-27: 10 mL

## 2016-07-27 MED ORDER — FENTANYL CITRATE (PF) 100 MCG/2ML IJ SOLN
INTRAMUSCULAR | Status: DC | PRN
  Administered 2016-07-27 (×5): 50 ug via INTRAVENOUS

## 2016-07-27 MED ORDER — LIDOCAINE HCL (CARDIAC) 20 MG/ML IV SOLN
INTRAVENOUS | Status: AC
  Filled 2016-07-27: qty 5

## 2016-07-27 MED ORDER — ONDANSETRON HCL 4 MG/2ML IJ SOLN
INTRAMUSCULAR | Status: DC | PRN
  Administered 2016-07-27: 4 mg via INTRAVENOUS

## 2016-07-27 MED ORDER — PHENYLEPHRINE HCL 10 MG/ML IJ SOLN
INTRAMUSCULAR | Status: DC | PRN
  Administered 2016-07-27: 80 ug via INTRAVENOUS

## 2016-07-27 MED ORDER — DEXAMETHASONE SODIUM PHOSPHATE 4 MG/ML IJ SOLN
INTRAMUSCULAR | Status: AC
  Filled 2016-07-27: qty 1

## 2016-07-27 MED ORDER — KETOROLAC TROMETHAMINE 30 MG/ML IJ SOLN: 30.0000 mg | Freq: Once | INTRAMUSCULAR | Status: DC | PRN

## 2016-07-27 SURGICAL SUPPLY — 37 items
APPLICATOR COTTON TIP 6IN STRL (MISCELLANEOUS) IMPLANT
CABLE HIGH FREQUENCY MONO STRZ (ELECTRODE) IMPLANT
CATH ROBINSON RED A/P 16FR (CATHETERS) ×3 IMPLANT
CLOSURE WOUND 1/2 X4 (GAUZE/BANDAGES/DRESSINGS) ×1
CLOTH BEACON ORANGE TIMEOUT ST (SAFETY) ×3 IMPLANT
DRSG OPSITE POSTOP 3X4 (GAUZE/BANDAGES/DRESSINGS) IMPLANT
DRSG TELFA 3X8 NADH (GAUZE/BANDAGES/DRESSINGS) ×3 IMPLANT
DURAPREP 26ML APPLICATOR (WOUND CARE) ×3 IMPLANT
ELECT REM PT RETURN 9FT ADLT (ELECTROSURGICAL) ×3
ELECTRODE REM PT RTRN 9FT ADLT (ELECTROSURGICAL) ×1 IMPLANT
GLOVE BIO SURGEON STRL SZ 6.5 (GLOVE) ×4 IMPLANT
GLOVE BIO SURGEONS STRL SZ 6.5 (GLOVE) ×2
GLOVE BIOGEL PI IND STRL 7.0 (GLOVE) ×3 IMPLANT
GLOVE BIOGEL PI INDICATOR 7.0 (GLOVE) ×6
GOWN STRL REUS W/TWL LRG LVL3 (GOWN DISPOSABLE) ×6 IMPLANT
NDL SAFETY ECLIPSE 18X1.5 (NEEDLE) ×1 IMPLANT
NEEDLE HYPO 18GX1.5 SHARP (NEEDLE) ×2
NEEDLE INSUFFLATION 120MM (ENDOMECHANICALS) ×3 IMPLANT
NS IRRIG 1000ML POUR BTL (IV SOLUTION) ×3 IMPLANT
PACK LAPAROSCOPY BASIN (CUSTOM PROCEDURE TRAY) ×3 IMPLANT
PACK TRENDGUARD 450 HYBRID PRO (MISCELLANEOUS) ×1 IMPLANT
PACK TRENDGUARD 600 HYBRD PROC (MISCELLANEOUS) IMPLANT
POUCH SPECIMEN RETRIEVAL 10MM (ENDOMECHANICALS) IMPLANT
PROTECTOR NERVE ULNAR (MISCELLANEOUS) ×6 IMPLANT
SET IRRIG TUBING LAPAROSCOPIC (IRRIGATION / IRRIGATOR) ×3 IMPLANT
SHEARS HARMONIC ACE PLUS 36CM (ENDOMECHANICALS) IMPLANT
SLEEVE XCEL OPT CAN 5 100 (ENDOMECHANICALS) ×6 IMPLANT
STRIP CLOSURE SKIN 1/2X4 (GAUZE/BANDAGES/DRESSINGS) ×2 IMPLANT
SUT VICRYL 0 UR6 27IN ABS (SUTURE) IMPLANT
SUT VICRYL 4-0 PS2 18IN ABS (SUTURE) ×3 IMPLANT
TOWEL OR 17X24 6PK STRL BLUE (TOWEL DISPOSABLE) ×6 IMPLANT
TRENDGUARD 450 HYBRID PRO PACK (MISCELLANEOUS) ×3
TRENDGUARD 600 HYBRID PROC PK (MISCELLANEOUS)
TROCAR OPTI TIP 5M 100M (ENDOMECHANICALS) ×3 IMPLANT
TROCAR XCEL NON-BLD 11X100MML (ENDOMECHANICALS) IMPLANT
WARMER LAPAROSCOPE (MISCELLANEOUS) ×3 IMPLANT
WATER STERILE IRR 1000ML POUR (IV SOLUTION) IMPLANT

## 2016-07-27 NOTE — Anesthesia Procedure Notes (Signed)
Procedure Name: Intubation Date/Time: 07/27/2016 9:03 AM Performed by: Jonna Munro Pre-anesthesia Checklist: Patient identified, Emergency Drugs available, Suction available, Patient being monitored and Timeout performed Patient Re-evaluated:Patient Re-evaluated prior to inductionOxygen Delivery Method: Circle system utilized Preoxygenation: Pre-oxygenation with 100% oxygen Intubation Type: IV induction Ventilation: Mask ventilation without difficulty Laryngoscope Size: Mac and 3 Grade View: Grade I Tube type: Oral Tube size: 6.5 mm Number of attempts: 1 Placement Confirmation: ETT inserted through vocal cords under direct vision,  positive ETCO2 and breath sounds checked- equal and bilateral Secured at: 21 cm Tube secured with: Tape Dental Injury: Teeth and Oropharynx as per pre-operative assessment

## 2016-07-27 NOTE — Addendum Note (Signed)
Addendum  created 07/27/16 1549 by Asher Muir, CRNA   Charge Capture section accepted

## 2016-07-27 NOTE — Anesthesia Postprocedure Evaluation (Addendum)
Anesthesia Post Note  Patient: Sally Williams  Procedure(s) Performed: Procedure(s) (LRB): LAPAROSCOPY DIAGNOSTIC (N/A)  Patient location during evaluation: PACU Anesthesia Type: General Level of consciousness: awake Pain management: pain level controlled Vital Signs Assessment: post-procedure vital signs reviewed and stable Respiratory status: spontaneous breathing Cardiovascular status: stable Postop Assessment: no signs of nausea or vomiting Anesthetic complications: no        Last Vitals:  Vitals:   07/27/16 1045 07/27/16 1100  BP: 116/77 98/64  Pulse: (!) 108 (!) 102  Resp: (!) 26 (!) 22  Temp:      Last Pain:  Vitals:   07/27/16 0819  TempSrc: Oral  PainSc: 0-No pain   Pain Goal: Patients Stated Pain Goal: 3 (07/27/16 0819)               HATCHETT JR,JOHN [place] [facility]

## 2016-07-27 NOTE — Discharge Instructions (Signed)
DISCHARGE INSTRUCTIONS: Laparoscopy  The following instructions have been prepared to help you care for yourself upon your return home today.  Wound care:  Do not get the incision wet for the first 24 hours. The incision should be kept clean and dry.  The Band-Aids or dressings may be removed the day after surgery.  Should the incision become sore, red, and swollen after the first week, check with your doctor.  Personal hygiene:  Shower the day after your procedure.  Activity and limitations:  Do NOT drive or operate any equipment today.  Do NOT lift anything more than 15 pounds for 2-3 weeks after surgery.  Do NOT rest in bed all day.  Walking is encouraged. Walk each day, starting slowly with 5-minute walks 3 or 4 times a day. Slowly increase the length of your walks.  Walk up and down stairs slowly.  Do NOT do strenuous activities, such as golfing, playing tennis, bowling, running, biking, weight lifting, gardening, mowing, or vacuuming for 2-4 weeks. Ask your doctor when it is okay to start.  Diet: Eat a light meal as desired this evening. You may resume your usual diet tomorrow.  Return to work: This is dependent on the type of work you do. For the most part you can return to a desk job within a week of surgery. If you are more active at work, please discuss this with your doctor.  What to expect after your surgery: You may have a slight burning sensation when you urinate on the first day. You may have a very small amount of blood in the urine. Expect to have a small amount of vaginal discharge/light bleeding for 1-2 weeks. It is not unusual to have abdominal soreness and bruising for up to 2 weeks. You may be tired and need more rest for about 1 week. You may experience shoulder pain for 24-72 hours. Lying flat in bed may relieve it.  Call your doctor for any of the following:  Develop a fever of 100.4 or greater  Inability to urinate 6 hours after discharge from  hospital  Severe pain not relieved by pain medications  Persistent of heavy bleeding at incision site  Redness or swelling around incision site after a week  Increasing nausea or vomiting  Patient Signature________________________________________ Nurse Signature_________________________________________Diagnostic Laparoscopy, Care After Introduction Refer to this sheet in the next few weeks. These instructions provide you with information about caring for yourself after your procedure. Your health care provider may also give you more specific instructions. Your treatment has been planned according to current medical practices, but problems sometimes occur. Call your health care provider if you have any problems or questions after your procedure. What can I expect after the procedure? After your procedure, it is common to have mild discomfort in the throat and abdomen. Follow these instructions at home:  Take over-the-counter and prescription medicines only as told by your health care provider.  Do not drive for 24 hours if you received a sedative.  Return to your normal activities as told by your health care provider.  Do not take baths, swim, or use a hot tub until your health care provider approves. You may shower.  Follow instructions from your health care provider about how to take care of your incision. Make sure you:  Wash your hands with soap and water before you change your bandage (dressing). If soap and water are not available, use hand sanitizer.  Change your dressing as told by your health care provider.  Leave stitches (sutures), skin glue, or adhesive strips in place. These skin closures may need to stay in place for 2 weeks or longer. If adhesive strip edges start to loosen and curl up, you may trim the loose edges. Do not remove adhesive strips completely unless your health care provider tells you to do that.  Check your incision area every day for signs of infection.  Check for:  More redness, swelling, or pain.  More fluid or blood.  Warmth.  Pus or a bad smell.  It is your responsibility to get the results of your procedure. Ask your health care provider or the department performing the procedure when your results will be ready. Contact a health care provider if:  There is new pain in your shoulders.  You feel light-headed or faint.  You are unable to pass gas or unable to have a bowel movement.  You feel nauseous or you vomit.  You develop a rash.  You have more redness, swelling, or pain around your incision.  You have more fluid or blood coming from your incision.  Your incision feels warm to the touch.  You have pus or a bad smell coming from your incision.  You have a fever or chills. Get help right away if:  Your pain is getting worse.  You have ongoing vomiting.  The edges of your incision open up.  You have trouble breathing.  You have chest pain. This information is not intended to replace advice given to you by your health care provider. Make sure you discuss any questions you have with your health care provider. Document Released: 05/10/2015 Document Revised: 11/04/2015 Document Reviewed: 02/09/2015  2017 Elsevier

## 2016-07-27 NOTE — H&P (Addendum)
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 Pain is now daily. In [place] she was given a daily medication for 3 months that stopped her periods. Pain was resolved during that time. Pain is not worse with sex or with her period. She would like to have kids and has not used contraception except the 3 months that the doctor in [place] gave her those meds.  U/s showed several medium size fibroids. Periods are monthly, lasting 7-8 day She has had multiple MAU visits for this pain, 2 ultrasounds in the last year and a CT last month, all showing the fibroids but no other abnormalities. She would like to conceive.     Past Medical History:  Diagnosis Date  . Dysmenorrhea   . Fibroid   . GERD (gastroesophageal reflux disease)   . Headache   . Spinal headache   . Ulcer [facility])     Past Surgical History:  Procedure Laterality Date  . NO PAST SURGERIES    . UPPER GI ENDOSCOPY      History reviewed. No pertinent family history.  Social History:  reports that she has never smoked. She has never used smokeless tobacco. She reports that she does not drink alcohol or use drugs.  Allergies: No Known Allergies  Prescriptions Prior to Admission  Medication Sig Dispense Refill Last Dose  . ULIPRISTAL ACETATE PO Take 5 mg by mouth at bedtime.   07/26/2016 at 2300  . ibuprofen (ADVIL,MOTRIN) 100 MG/5ML suspension Take 20 mLs (400 mg total) by mouth every 6 (six) hours as needed. (Patient not taking: Reported on 07/07/2016) 473 mL 1 Not Taking  . norgestimate-ethinyl estradiol (ORTHO-CYCLEN,SPRINTEC,PREVIFEM) 0.25-35 MG-MCG tablet Take 1 tablet by mouth daily. (Patient not taking: Reported on 07/07/2016) 1 Package 2 Not Taking  . oxyCODONE-acetaminophen (PERCOCET/ROXICET) 5-325 MG tablet Take 1 tablet by mouth every 4 (four) hours as needed for severe pain. (Patient not taking: Reported on 07/07/2016) 10 tablet 0 Not  Taking  . traMADol (ULTRAM) 50 MG tablet Take 1 tablet (50 mg total) by mouth every 6 (six) hours as needed. (Patient not taking: Reported on 07/07/2016) 15 tablet 0 Not Taking    ROS  Blood pressure 120/71, pulse 71, temperature 98.2 F (36.8 C), temperature source Oral, resp. rate 18, height 4\' 10"  (1.473 m), weight 51.3 kg (113 lb 3.2 oz), SpO2 100 %. Physical Exam  Heart- rrr Lungs- CTAB Abd- benign  No results found for this or any previous visit (from the past 24 hour(s)).  No results found.  Assessment/Plan: Chronic pelvic pain, dysmenorrhea- She has several fibroids (that would account for her DUB) but I will do a laparoscopy to look for endometriosis or any other etiology to explain her severe pelvic pain.  She understands the risks of surgery, including, but not to infection, bleeding, DVTs, damage to bowel, bladder, ureters. She wishes to proceed.     Emily Filbert 07/27/2016, 8:36 AM

## 2016-07-27 NOTE — Transfer of Care (Signed)
Immediate Anesthesia Transfer of Care Note  Patient: Sally Williams  Procedure(s) Performed: Procedure(s): LAPAROSCOPY DIAGNOSTIC (N/A)  Patient Location: PACU  Anesthesia Type:General  Level of Consciousness: awake, alert  and oriented  Airway & Oxygen Therapy: Patient Spontanous Breathing and Patient connected to nasal cannula oxygen  Post-op Assessment: Report given to RN and Post -op Vital signs reviewed and stable  Post vital signs: Reviewed and stable  Last Vitals:  Vitals:   07/27/16 0819  BP: 120/71  Pulse: 71  Resp: 18  Temp: 36.8 C    Last Pain:  Vitals:   07/27/16 0819  TempSrc: Oral  PainSc: 0-No pain      Patients Stated Pain Goal: 3 ([id number])  Complications: No apparent anesthesia complications

## 2016-07-27 NOTE — Op Note (Signed)
07/27/2016  9:46 AM  PATIENT:  Sally Williams  31 y.o. female  PRE-OPERATIVE DIAGNOSIS:  cpt [id number] - Chronic pelvic pain, fibroids  POST-OPERATIVE DIAGNOSIS:  cpt 49320 - same + probable Stage 1 endometriosis  PROCEDURE:  Procedure(s): LAPAROSCOPY DIAGNOSTIC (N/A)  SURGEON:  Surgeon(s) and Role:    * Emily Filbert, MD - Primary  PHYSICIAN ASSISTANT:   ASSISTANTS: none   ANESTHESIA:   general  EBL:  Total I/O In: -  Out: 105 [Urine:100; Blood:5]  BLOOD ADMINISTERED:none  DRAINS: none   LOCAL MEDICATIONS USED:  MARCAINE     SPECIMEN:  Source of Specimen:  peritoneum from left and right cul de sac  DISPOSITION OF SPECIMEN:  PATHOLOGY  COUNTS:  YES  TOURNIQUET:  * No tourniquets in log *  DICTATION: .Dragon Dictation  PLAN OF CARE: Discharge to home after PACU  PATIENT DISPOSITION:  PACU - hemodynamically stable.   Delay start of Pharmacological VTE agent (>24hrs) due to surgical blood loss or risk of bleeding: not applicable  The risks, benefits, and alternatives of surgery were explained, understood, accepted. In the operating room she was placed in the dorsal lithotomy position, and general anesthesia was given without complication. Her abdomen and vagina were prepped and draped in the usual sterile fashion. A timeout procedure was done.  A Hulka manipulator was placed. Her bladder was emptied with a Robinson catheter. Gloves were changed, and attention was turned to the abdomen. Approximately the 5 mL of 0.5% Marcaine was injected into the umbilicus. A vertical incision was made at the site. A varies needle was placed intraperitoneally. Low-flow CO2 was used to insufflate the abdomen to approximately 1.8 L. Once a good pneumoperitoneum was established, a 5 mm Excel trocar was placed. Laparoscopy confirmed correct placement. A 5 mm port was placed in each lower quadrant under direct laparoscopic visualization after injecting 0.5% Marcaine in the incision sites. Her liver  appeared normal but her gallbladder was somewhat enlarged. The ampullary region of both oviducts was larger than normal. The fimbria on both sides were mostly agglutinated.  The right ovary was densely adherent to the posterior side of the uterus on the right. The left ovary was mostly free. Both ovaries were otherwise normal. There was a lesion in the lower left cul de sac that was c/w endometriosis as was a lesion just posterior to the right ureter (about 1 cm away). I removed both lesions entirely with biopsy forceps. Hemostasis was noted. The CO2 was allowed to escape from the abdomen. I removed the 5 mm ports and noted hemostasis. A subcuticular closure was done with 4-0 Vicryl suture at all incision sites. A Steri-Strip was placed across each incision. She was extubated and taken to the recovery room in stable condition.

## 2016-07-28 ENCOUNTER — Encounter (HOSPITAL_COMMUNITY): Payer: Self-pay | Admitting: Obstetrics & Gynecology

## 2016-08-21 ENCOUNTER — Ambulatory Visit: Payer: Self-pay | Admitting: Obstetrics & Gynecology

## 2016-08-21 ENCOUNTER — Encounter: Payer: Self-pay | Admitting: Obstetrics & Gynecology

## 2016-08-21 VITALS — BP 105/66 | HR 81 | Wt 116.4 lb

## 2016-08-21 DIAGNOSIS — Z9889 Other specified postprocedural states: Secondary | ICD-10-CM

## 2016-08-21 NOTE — Progress Notes (Signed)
   Subjective:    Patient ID: Sally Williams, female    DOB: 03/24/1986, 31 y.o.   MRN: [id number]  HPI  31 yo MA G0 here for her 6 week post op visit s/p dx laparoscopy with excision of endometriosis (stage 1). The pathology confirms endometriosis. She denies any post op pains.  She does tell me that she has a 4 month h/o a "burning" sensation of her skin on her left abdomen and a "poking" senstion of her left paraspinal area.  Review of Systems     Objective:   Physical Exam [id number] Breathing, conversing, and ambulating normally No CVAT or other point tenderness along spine Incisions- well-healed      Assessment & Plan:  Stage 1 endometriosis- If her pain returns, then she will need to see Dr. Governor Specking Desire for pregnancy- rec MVI daily, if she needs fertility help, then I have given her Dr. Charlett Lango number

## 2016-12-25 NOTE — Addendum Note (Signed)
Addendum  created 12/25/16 1743 by Suzette Battiest, MD   Sign clinical note

## 2017-02-02 ENCOUNTER — Encounter: Payer: Self-pay | Admitting: Internal Medicine

## 2017-02-02 ENCOUNTER — Ambulatory Visit (INDEPENDENT_AMBULATORY_CARE_PROVIDER_SITE_OTHER): Payer: Self-pay | Admitting: Internal Medicine

## 2017-02-02 VITALS — BP 128/82 | HR 70 | Resp 12 | Ht <= 58 in | Wt 123.0 lb

## 2017-02-02 DIAGNOSIS — D649 Anemia, unspecified: Secondary | ICD-10-CM

## 2017-02-02 DIAGNOSIS — N809 Endometriosis, unspecified: Secondary | ICD-10-CM

## 2017-02-02 DIAGNOSIS — K219 Gastro-esophageal reflux disease without esophagitis: Secondary | ICD-10-CM | POA: Insufficient documentation

## 2017-02-02 DIAGNOSIS — G8929 Other chronic pain: Secondary | ICD-10-CM

## 2017-02-02 DIAGNOSIS — D251 Intramural leiomyoma of uterus: Secondary | ICD-10-CM

## 2017-02-02 DIAGNOSIS — K279 Peptic ulcer, site unspecified, unspecified as acute or chronic, without hemorrhage or perforation: Secondary | ICD-10-CM

## 2017-02-02 DIAGNOSIS — Z79899 Other long term (current) drug therapy: Secondary | ICD-10-CM

## 2017-02-02 DIAGNOSIS — Z23 Encounter for immunization: Secondary | ICD-10-CM

## 2017-02-02 DIAGNOSIS — N979 Female infertility, unspecified: Secondary | ICD-10-CM

## 2017-02-02 DIAGNOSIS — R102 Pelvic and perineal pain: Secondary | ICD-10-CM

## 2017-02-02 DIAGNOSIS — R739 Hyperglycemia, unspecified: Secondary | ICD-10-CM

## 2017-02-02 HISTORY — DX: Endometriosis, unspecified: N80.9

## 2017-02-02 MED ORDER — FAMOTIDINE 20 MG PO TABS: ORAL_TABLET | ORAL | 4 refills | Status: DC

## 2017-02-02 NOTE — Progress Notes (Signed)
Subjective:    Patient ID: Sally Williams, female    DOB: [birth year], 31 y.o.   MRN: [id number]  HPI   Here to establish  1.  Left low back pain:  Started out in LLQ or pelvic area about 1.5 years and has spread to low back.  Describes as a burning pain.  Has had pain for 1 year.  Has been evaluated by Dr. Clovia Cuff at [facility], first visit end of October. Laparoscopic surgery 07/27/2016 and found to have endometriosis along with the uterine fibroids.  She did have removal of endometrial rests.  Fimbriae of the fallopian tubes were noted to be essentially agglutinated.  Not clear why she did not follow up as an outpatient. She relates at the time, she and her husband were not planning to conceive right away and so she restarted BCPs from [place].   Today, she and her husband state they would like to conceive.   She stopped her BCPs on August 14th and states she is always in pain since.  No pain while on the pills.      Previously, when living in [place], evaluated in 2016 and thought to just have a fibroid and treated with hormonal therapy (Ulipristal--a progestin receptor modulater) that stopped her cycle and menstruation.  This helped the pain, but recurred after she came off. Was taking the hormone for 3 months, followed by 1 month off.  Took this for a year in this manner.    2.  History of PUD:  Epigastric pain and burning.  Has had for 2-3 months.   History of EGD back in [place] in 2014.  Found 1 ulcer.  Was given an antibiotic and Pensec, which apparently is a gas reducer.  Has not taken anything OTC for this episode.   States she has the pain after breakfast for 4 hours.  Has been taking Fenegreek, which she feels helps at times.   She can get the pain after each meal, but mainly breakfast or when eats Sally food.   Pain can go up into chest.   Does not take any antiinflammatory medication.  1 large mug of coffee daily.  Does eat a lot of tomatoes.  No alcohol or  tobacco. No melena or hematochezia  Dr. Governor Specking, reproductive Endocrinology and Infertility ultimately found as someone to whom she was to be referred with problem #1  Current Meds  Medication Sig  . ULIPRISTAL ACETATE PO Take 5 mg by mouth at bedtime.    No Known Allergies   Past Medical History:  Diagnosis Date  . Dysmenorrhea   . Endometriosis determined by laparoscopy 02/02/2017  . Fibroid   . GERD (gastroesophageal reflux disease)   . Headache   . PUD (peptic ulcer disease) 2014  . Spinal headache   . Ulcer     Past Surgical History:  Procedure Laterality Date  . LAPAROSCOPY N/A 07/27/2016   Procedure: LAPAROSCOPY DIAGNOSTIC;  Surgeon: Emily Filbert, MD;  Location: [facility];  Service: Gynecology;  Laterality: N/A;  . NO PAST SURGERIES    . UPPER GI ENDOSCOPY      Family History  Problem Relation Age of Onset  . Arthritis Mother   . Heart disease Father   . Diabetes Father   . Stroke Father      Review of Systems     Objective:   Physical Exam NAD HEENT:  PERRL, EOMI TMs pearly gray, throat without injection Neck:  Supple, No adenopathy, no  thyromegaly Chest:  CTA CV:  RRR with normal S1 and S2, No S3, S4 or murmur.  Radial and DP pulses normal and equal. Abd:  S, + BS, tender in LLQ, no rebound or peritoneal signs. Minimal tenderness midepigastrium as well.  No HSM or mass.        Assessment & Plan:  1.  Endometriosis with infertility:  Referral first to Dr. Governor Specking who appears to be associated with [facility].  If he does not take orange card or cannot apply for [facility], will refer to [facility].  Couple given paperwork for the latter should we move that way. Discussed if we can get her in shortly to one of the above, to hold on taking her hormones so her symptoms can be fully appreciated.  If too painful, may restart.   2.  GERD/possibly PUD:  Reflux precautions.  Famotidine 40 gm at bedtime. Check CBC, CMP. Follow up in  2 months  3.  Anemia history:  CBC  4.  HM:  Tdap today.

## 2017-02-02 NOTE — Progress Notes (Signed)
Social work Chief Executive Officer completed new pt screening for behavioral health. Pt reported that she is worrying a lot and has a hard time stopping worrying. She reported that she wanted to change her diet. SWI provided information regarding social work services at the clinic and contact info.

## 2017-02-03 LAB — CBC WITH DIFFERENTIAL/PLATELET
BASOS: 0 %
Basophils Absolute: 0 10*3/uL (ref 0.0–0.2)
EOS (ABSOLUTE): 0.4 10*3/uL (ref 0.0–0.4)
EOS: 5 %
HEMATOCRIT: 41.2 % (ref 34.0–46.6)
Hemoglobin: 13.9 g/dL (ref 11.1–15.9)
IMMATURE GRANS (ABS): 0 10*3/uL (ref 0.0–0.1)
IMMATURE GRANULOCYTES: 0 %
LYMPHS: 29 %
Lymphocytes Absolute: 2.7 10*3/uL (ref 0.7–3.1)
MCH: 27.4 pg (ref 26.6–33.0)
MCHC: 33.7 g/dL (ref 31.5–35.7)
MCV: 81 fL (ref 79–97)
MONOCYTES: 5 %
Monocytes Absolute: 0.5 10*3/uL (ref 0.1–0.9)
NEUTROS ABS: 5.8 10*3/uL (ref 1.4–7.0)
NEUTROS PCT: 61 %
Platelets: 245 10*3/uL (ref 150–379)
RBC: 5.08 x10E6/uL (ref 3.77–5.28)
RDW: 22.9 % — ABNORMAL HIGH (ref 12.3–15.4)
WBC: 9.4 10*3/uL (ref 3.4–10.8)

## 2017-02-03 LAB — COMPREHENSIVE METABOLIC PANEL
A/G RATIO: 1.8 (ref 1.2–2.2)
ALT: 107 IU/L — AB (ref 0–32)
AST: 53 IU/L — AB (ref 0–40)
Albumin: 4.4 g/dL (ref 3.5–5.5)
Alkaline Phosphatase: 93 IU/L (ref 39–117)
BUN/Creatinine Ratio: 14 (ref 9–23)
BUN: 8 mg/dL (ref 6–20)
Bilirubin Total: <0.2 mg/dL (ref 0.0–1.2)
CALCIUM: 9.3 mg/dL (ref 8.7–10.2)
CO2: 23 mmol/L (ref 20–29)
CREATININE: 0.56 mg/dL — AB (ref 0.57–1.00)
Chloride: 102 mmol/L (ref 96–106)
GFR, EST AFRICAN AMERICAN: 144 mL/min/{1.73_m2} (ref >59)
GFR, EST NON AFRICAN AMERICAN: 125 mL/min/{1.73_m2} (ref >59)
GLOBULIN, TOTAL: 2.5 g/dL (ref 1.5–4.5)
Glucose: 106 mg/dL — ABNORMAL HIGH (ref 65–99)
Potassium: 4 mmol/L (ref 3.5–5.2)
SODIUM: 140 mmol/L (ref 134–144)
TOTAL PROTEIN: 6.9 g/dL (ref 6.0–8.5)

## 2017-02-11 LAB — SPECIMEN STATUS REPORT

## 2017-02-11 LAB — HGB A1C W/O EAG: Hgb A1c MFr Bld: 5 % (ref 4.8–5.6)

## 2017-02-14 ENCOUNTER — Telehealth: Payer: Self-pay | Admitting: Internal Medicine

## 2017-03-02 ENCOUNTER — Encounter: Payer: Self-pay | Admitting: Internal Medicine

## 2017-03-02 ENCOUNTER — Ambulatory Visit (INDEPENDENT_AMBULATORY_CARE_PROVIDER_SITE_OTHER): Payer: Self-pay | Admitting: Internal Medicine

## 2017-03-02 VITALS — BP 122/80 | HR 70 | Resp 12 | Ht <= 58 in | Wt 123.0 lb

## 2017-03-02 DIAGNOSIS — K047 Periapical abscess without sinus: Secondary | ICD-10-CM

## 2017-03-02 MED ORDER — PENICILLIN V POTASSIUM 250 MG PO TABS: 250.0000 mg | ORAL_TABLET | Freq: Four times a day (QID) | ORAL | 0 refills | Status: DC

## 2017-03-02 MED ORDER — IBUPROFEN 100 MG/5ML PO SUSP: ORAL | Status: DC

## 2017-03-02 NOTE — Progress Notes (Signed)
   Subjective:    Patient ID: Sally Williams, female    DOB: 26-Nov-1985, 31 y.o.   MRN: [id number]  HPI   Left sided tooth pain worsening in past 2 days.  Has had bleeding from the gums and some sensitivity for past 3 months.  Mild swelling on the left side for past 2 days as well No fever. Taking liquid ibuprofen syrup, because pills cause vomiting.  Taking about 200 mg every 4-6 hours.  Helps with pain. Has not been to a dentist since 2013  Current Meds  Medication Sig  . famotidine (PEPCID) 20 MG tablet 2 tabs by mouth at bedtime  . ULIPRISTAL ACETATE PO Take 5 mg by mouth at bedtime.    No Known Allergies     Review of Systems     Objective:   Physical Exam   NAD HEENT:  PERRL, EOMI, TMs pearly gray, throat without injection, no swellling of gum, mild swelling of buccal area overlying her left jaw. Neck:  Supple, No adenopathy Chest:  CTA CV:  RRR without murmur or rub, radial pulses normal and equal        Assessment & Plan:  Abscessed Tooth:  Ibuprofen with food 200 to 400 mg for pain.  Penicillin 250 mg 4 times daily for 7 days. Dental referral.

## 2017-03-07 NOTE — Progress Notes (Signed)
Urgent dental referral faxed on 03/02/17

## 2017-03-18 ENCOUNTER — Telehealth: Payer: Self-pay | Admitting: Internal Medicine

## 2017-03-18 NOTE — Telephone Encounter (Signed)
Called in this afternoon with complaint of periumbilical abdominal pain similar to what she has had with ulcers and has had in recent past.  Started today.   Took some pink "antacid liquid"  Today and later had a black stool.  Vomited up some clear mucous about 1/2 hour prior to black stool and had a small amount of bright red blood in it. Discussed she should be taking the Famotidine 40 mg daily.   Discussed the black stools likely caused by what sounds like generic Pepto Bismal--to avoid. No light headedness. To drink clear liquids and in several hours, if keep it down, advance to bland diet. If worsens, with recurrent vomiting of blood, to go to ED. Otherwise to call clinic in the morning to discuss whether needs an appointment.

## 2017-04-03 ENCOUNTER — Ambulatory Visit (INDEPENDENT_AMBULATORY_CARE_PROVIDER_SITE_OTHER): Payer: Self-pay | Admitting: Internal Medicine

## 2017-04-03 ENCOUNTER — Encounter: Payer: Self-pay | Admitting: Internal Medicine

## 2017-04-03 VITALS — BP 118/70 | HR 70 | Resp 12 | Ht <= 58 in | Wt 125.0 lb

## 2017-04-03 DIAGNOSIS — Z23 Encounter for immunization: Secondary | ICD-10-CM

## 2017-04-03 DIAGNOSIS — L709 Acne, unspecified: Secondary | ICD-10-CM | POA: Insufficient documentation

## 2017-04-03 DIAGNOSIS — R748 Abnormal levels of other serum enzymes: Secondary | ICD-10-CM | POA: Insufficient documentation

## 2017-04-03 DIAGNOSIS — N809 Endometriosis, unspecified: Secondary | ICD-10-CM

## 2017-04-03 DIAGNOSIS — Z6833 Body mass index (BMI) 33.0-33.9, adult: Secondary | ICD-10-CM

## 2017-04-03 DIAGNOSIS — K279 Peptic ulcer, site unspecified, unspecified as acute or chronic, without hemorrhage or perforation: Secondary | ICD-10-CM

## 2017-04-03 DIAGNOSIS — K219 Gastro-esophageal reflux disease without esophagitis: Secondary | ICD-10-CM

## 2017-04-03 DIAGNOSIS — E6609 Other obesity due to excess calories: Secondary | ICD-10-CM

## 2017-04-03 HISTORY — DX: Abnormal levels of other serum enzymes: R74.8

## 2017-04-03 MED ORDER — FAMOTIDINE 20 MG PO TABS: ORAL_TABLET | ORAL | 4 refills | Status: AC

## 2017-04-03 MED ORDER — INFLUENZA VAC SPLIT QUAD 0.5 ML IM SUSY: 0.5000 mL | PREFILLED_SYRINGE | Freq: Once | INTRAMUSCULAR | 0 refills | Status: AC

## 2017-04-03 MED ORDER — TRIAMCINOLONE ACETONIDE 0.5 % EX CREA: 1.0000 "application " | TOPICAL_CREAM | Freq: Two times a day (BID) | CUTANEOUS | 1 refills | Status: AC

## 2017-04-03 NOTE — Patient Instructions (Signed)
Drink a glass of water before every meal Drink 6-8 glasses of water daily Eat three meals daily Eat a protein and healthy fat with every meal (eggs,fish, chicken, [place] and limit red meats) Eat 5 servings of vegetables daily, mix the colors Eat 2 servings of fruit daily with skin, if skin is edible Use smaller plates Put food/utensils down as you chew and swallow each bite Eat at a table with friends/family at least once daily, no TV Do not eat in front of the TV  Eucerin Cream for eczema relief all over after bathing

## 2017-04-03 NOTE — Progress Notes (Signed)
   Subjective:    Patient ID: Sally Williams, female    DOB: 1985-09-01, 31 y.o.   MRN: [id number]  HPI   1.  Abscessed Tooth:  Doing better.  Did not take antibiotic.  States she does not like to take medication.  Still having bleeding.  2.  PUD/GERD:  Has been taking Famotidine 40 mg daily.  Still with some burning epigastric pain, generally about 2 hours after lunch 2-3 times per week.  Prior to starting Famotidine had pain 5 days weekly.  Has Sharp right sternal chest pain about 2 hours after eating lunch as well.  Does not feel the right chest pain has improved.  Does rub the area when she has the pain.   Left upper quadrant pain resolved Chest discomfort. Has not elevated HOB Drinking only decaf and green tea now.  No soda. States the original generic she received the first month worked better.  3.  Elevated liver enzymes:  Did not get follow up for viral testing.    4.  Itchy, blistering, flaking skin on left index finger.  Worse when uses soap and water.  Also with a patch on dorsal foot.  Has had for many years.  Treated with cortisone cream in the past.    Current Meds  Medication Sig  . famotidine (PEPCID) 20 MG tablet 2 tabs by mouth at bedtime  . ULIPRISTAL ACETATE PO Take 5 mg by mouth at bedtime.   No Known Allergies   Review of Systems     Objective:   Physical Exam  NAD Lungs:  CTA.  Tender over right sternal area--reproduces pain CV:  RRR without murmur or rub. Abd:  S, NT, No HSM or mass, + BS Skin:  Flaking area of medial left index finger and faint patch on distal dorsal left foot.  Faint open comedones on nose and chin.      Assessment & Plan:  1.  PUD/GERD/chest wall pain:  Stop pressing on chest.  Will continue Famotidine for a couple more months.  To call if unable to get previous generic Famotidine that worked better for her and wants to switch to Ranitidine.  Would like to hold on switching to PPI. To elevate HOB.   2.  Elevated Liver Enzymes:  Did  not get her back for Hepatitis work up.  Had only been off hormones from her home [place] for 10 days when seen in August and had bloodwork done.  Asking her to hold the hormone until I have a chance to speak with infertility/ob at [facility].  Hepatic profile, Hep A, Hep B Core Ab, Hep B SAg, Hep B Sab, Hep C.  3.  Eczema:  Triamcinolone cream 0.5% cream twice daily to affected areas.  Eucerin cream for eczema relief all over twice daily.  4.  Blackheads:  As she also complains of dryness in same area, recommended using black head removal after shower to remove and not topical treatments that will dry out her skin more  5.  Dental complaints:  Awaiting referral to dental clinic. Asked patient to share next time if not planning to take medication.  6.  HM:  Flu vaccine at [facility].  7.  Obesity :  Encouraged healthier eating and regular physical activity.

## 2017-04-04 LAB — HEPATIC FUNCTION PANEL
ALBUMIN: 4.3 g/dL (ref 3.5–5.5)
ALK PHOS: 71 IU/L (ref 39–117)
ALT: 41 IU/L — AB (ref 0–32)
AST: 29 IU/L (ref 0–40)
Bilirubin Total: 0.3 mg/dL (ref 0.0–1.2)
Bilirubin, Direct: 0.09 mg/dL (ref 0.00–0.40)
Total Protein: 7 g/dL (ref 6.0–8.5)

## 2017-04-04 LAB — HEPATITIS A ANTIBODY, TOTAL: HEP A TOTAL AB: POSITIVE — AB

## 2017-04-04 LAB — HEPATITIS B SURFACE ANTIGEN: Hepatitis B Surface Ag: NEGATIVE

## 2017-04-04 LAB — HEPATITIS B SURFACE ANTIBODY,QUALITATIVE: Hep B Surface Ab, Qual: NONREACTIVE

## 2017-04-04 LAB — HEPATITIS B CORE ANTIBODY, TOTAL: HEP B C TOTAL AB: NEGATIVE

## 2017-04-04 LAB — HEPATITIS C ANTIBODY: Hep C Virus Ab: <0.1 s/co ratio (ref 0.0–0.9)

## 2017-06-07 ENCOUNTER — Encounter (HOSPITAL_COMMUNITY): Payer: Self-pay

## 2017-06-07 ENCOUNTER — Inpatient Hospital Stay (HOSPITAL_COMMUNITY)
Admission: AD | Admit: 2017-06-07 | Discharge: 2017-06-08 | Disposition: A | Discharge status: Home / Self Care | Payer: Self-pay | Source: Ambulatory Visit | Attending: Family Medicine | Admitting: Family Medicine

## 2017-06-07 DIAGNOSIS — R102 Pelvic and perineal pain: Secondary | ICD-10-CM | POA: Insufficient documentation

## 2017-06-07 DIAGNOSIS — N809 Endometriosis, unspecified: Secondary | ICD-10-CM | POA: Insufficient documentation

## 2017-06-07 DIAGNOSIS — R7989 Other specified abnormal findings of blood chemistry: Secondary | ICD-10-CM | POA: Insufficient documentation

## 2017-06-07 DIAGNOSIS — Z79899 Other long term (current) drug therapy: Secondary | ICD-10-CM | POA: Insufficient documentation

## 2017-06-07 DIAGNOSIS — Z3202 Encounter for pregnancy test, result negative: Secondary | ICD-10-CM | POA: Insufficient documentation

## 2017-06-07 DIAGNOSIS — R1110 Vomiting, unspecified: Secondary | ICD-10-CM | POA: Insufficient documentation

## 2017-06-07 DIAGNOSIS — Z8711 Personal history of peptic ulcer disease: Secondary | ICD-10-CM | POA: Insufficient documentation

## 2017-06-07 DIAGNOSIS — K219 Gastro-esophageal reflux disease without esophagitis: Secondary | ICD-10-CM | POA: Insufficient documentation

## 2017-06-07 LAB — POCT PREGNANCY, URINE: PREG TEST UR: NEGATIVE

## 2017-06-07 LAB — CBC
HEMATOCRIT: 42.3 % (ref 36.0–46.0)
Hemoglobin: 15.4 g/dL — ABNORMAL HIGH (ref 12.0–15.0)
MCH: 30.2 pg (ref 26.0–34.0)
MCHC: 36.4 g/dL — AB (ref 30.0–36.0)
MCV: 82.9 fL (ref 78.0–100.0)
Platelets: 321 10*3/uL (ref 150–400)
RBC: 5.1 MIL/uL (ref 3.87–5.11)
RDW: 12.6 % (ref 11.5–15.5)
WBC: 11.2 10*3/uL — ABNORMAL HIGH (ref 4.0–10.5)

## 2017-06-07 LAB — COMPREHENSIVE METABOLIC PANEL
ALBUMIN: 4.6 g/dL (ref 3.5–5.0)
ALT: 31 U/L (ref 14–54)
ANION GAP: 14 (ref 5–15)
AST: 31 U/L (ref 15–41)
Alkaline Phosphatase: 81 U/L (ref 38–126)
BILIRUBIN TOTAL: 1.3 mg/dL — AB (ref 0.3–1.2)
BUN: 8 mg/dL (ref 6–20)
CO2: 23 mmol/L (ref 22–32)
Calcium: 9.5 mg/dL (ref 8.9–10.3)
Chloride: 102 mmol/L (ref 101–111)
Creatinine, Ser: 0.72 mg/dL (ref 0.44–1.00)
GFR calc non Af Amer: >60 mL/min (ref >60)
GLUCOSE: 122 mg/dL — AB (ref 65–99)
POTASSIUM: 4.4 mmol/L (ref 3.5–5.1)
Sodium: 139 mmol/L (ref 135–145)
TOTAL PROTEIN: 8.3 g/dL — AB (ref 6.5–8.1)

## 2017-06-07 LAB — WET PREP, GENITAL
CLUE CELLS WET PREP: NONE SEEN
Sperm: NONE SEEN
TRICH WET PREP: NONE SEEN
YEAST WET PREP: NONE SEEN

## 2017-06-07 LAB — URINALYSIS, ROUTINE W REFLEX MICROSCOPIC
BILIRUBIN URINE: NEGATIVE
GLUCOSE, UA: NEGATIVE mg/dL
HGB URINE DIPSTICK: NEGATIVE
Ketones, ur: NEGATIVE mg/dL
Leukocytes, UA: NEGATIVE
Nitrite: NEGATIVE
PROTEIN: NEGATIVE mg/dL
Specific Gravity, Urine: 1.001 — ABNORMAL LOW (ref 1.005–1.030)
pH: 7 (ref 5.0–8.0)

## 2017-06-07 LAB — LIPASE, BLOOD: LIPASE: 48 U/L (ref 11–51)

## 2017-06-07 MED ORDER — PROMETHAZINE HCL 25 MG PO TABS: 25.0000 mg | ORAL_TABLET | Freq: Three times a day (TID) | ORAL | 0 refills | Status: AC | PRN

## 2017-06-07 MED ORDER — MELOXICAM 15 MG PO TABS: 15.0000 mg | ORAL_TABLET | Freq: Every day | ORAL | 0 refills | Status: DC

## 2017-06-07 MED ORDER — OXYCODONE HCL 5 MG PO TABS
10.0000 mg | ORAL_TABLET | Freq: Once | ORAL | Status: AC
  Administered 2017-06-07: 10 mg via ORAL
  Filled 2017-06-07: qty 2

## 2017-06-07 MED ORDER — TRAMADOL HCL 50 MG PO TABS: 50.0000 mg | ORAL_TABLET | Freq: Three times a day (TID) | ORAL | 0 refills | Status: DC | PRN

## 2017-06-07 MED ORDER — ONDANSETRON 8 MG PO TBDP
8.0000 mg | ORAL_TABLET | Freq: Once | ORAL | Status: AC
  Administered 2017-06-07: 8 mg via ORAL
  Filled 2017-06-07: qty 1

## 2017-06-07 MED ORDER — KETOROLAC TROMETHAMINE 60 MG/2ML IM SOLN
60.0000 mg | Freq: Once | INTRAMUSCULAR | Status: AC
  Administered 2017-06-07: 60 mg via INTRAMUSCULAR
  Filled 2017-06-07: qty 2

## 2017-06-07 NOTE — MAU Note (Signed)
PT  SAYS VOMITING STARTED ON 12-17.     NO FEVER.  NOONE ELSE AT HOME  SICK.     WAS ON  BCP-  SHE STOPPED .  LAST SEX-  YESTERDAY .  PAIN -  ABD AND LEGS

## 2017-06-07 NOTE — Discharge Instructions (Signed)
Endometriosis Endometriosis is a condition in which the tissue that lines the uterus (endometrium) grows outside of its normal location. The tissue may grow in many locations close to the uterus, but it commonly grows on the ovaries, fallopian tubes, vagina, or bowel. When the uterus sheds the endometrium every menstrual cycle, there is bleeding wherever the endometrial tissue is located. This can cause pain because blood is irritating to tissues that are not normally exposed to it. What are the causes? The cause of endometriosis is not known. What increases the risk? You may be more likely to develop endometriosis if you:  Have a family history of endometriosis.  Have never given birth.  Started your period at age 10 or younger.  Have high levels of estrogen in your body.  Were exposed to a certain medicine (diethylstilbestrol) before you were born (in utero).  Had low birth weight.  Were born as a twin, triplet, or other multiple.  Have a BMI of less than 25. BMI is an estimate of body fat and is calculated from height and weight.  What are the signs or symptoms? Often, there are no symptoms of this condition. If you do have symptoms, they may:  Vary depending on where your endometrial tissue is growing.  Occur during your menstrual period (most common) or midcycle.  Come and go, or you may go months with no symptoms at all.  Stop with menopause.  Symptoms may include:  Pain in the back or abdomen.  Heavier bleeding during periods.  Pain during sex.  Painful bowel movements.  Infertility.  Pelvic pain.  Bleeding more than once a month.  How is this diagnosed? This condition is diagnosed based on your symptoms and a physical exam. You may have tests, such as:  Blood tests and urine tests. These may be done to help rule out other possible causes of your symptoms.  Ultrasound, to look for abnormal tissues.  An X-ray of the lower bowel (barium enema).  An  ultrasound that is done through the vagina (transvaginally).  CT scan.  MRI.  Laparoscopy. In this procedure, a lighted, pencil-sized instrument called a laparoscope is inserted into your abdomen through an incision. The laparoscope allows your health care provider to look at the organs inside your body and check for abnormal tissue to confirm the diagnosis. If abnormal tissue is found, your health care provider may remove a small piece of tissue (biopsy) to be examined under a microscope.  How is this treated? Treatment for this condition may include:  Medicines to relieve pain, such as NSAIDs.  Hormone therapy. This involves using artificial (synthetic) hormones to reduce endometrial tissue growth. Your health care provider may recommend using a hormonal form of birth control, or other medicines.  Surgery. This may be done to remove abnormal endometrial tissue. ? In some cases, tissue may be removed using a laparoscope and a laser (laparoscopic laser treatment). ? In severe cases, surgery may be done to remove the fallopian tubes, uterus, and ovaries (hysterectomy).  Follow these instructions at home:  Take over-the-counter and prescription medicines only as told by your health care provider.  Do not drive or use heavy machinery while taking prescription pain medicine.  Try to avoid activities that cause pain, including sexual activity.  Keep all follow-up visits as told by your health care provider. This is important. Contact a health care provider if:  You have pain in the area between your hip bones (pelvic area) that occurs: ? Before, during, or   after your period. ? In between your period and gets worse during your period. ? During or after sex. ? With bowel movements or urination, especially during your period.  You have problems getting pregnant.  You have a fever. Get help right away if:  You have severe pain that does not get better with medicine.  You have severe  nausea and vomiting, or you cannot eat without vomiting.  You have pain that affects only the lower, right side of your abdomen.  You have abdominal pain that gets worse.  You have abdominal swelling.  You have blood in your stool. This information is not intended to replace advice given to you by your health care provider. Make sure you discuss any questions you have with your health care provider. Document Released: 05/26/2000 Document Revised: 03/03/2016 Document Reviewed: 10/30/2015 Elsevier Interactive Patient Education  2018 Elsevier Inc.  

## 2017-06-07 NOTE — MAU Provider Note (Signed)
History  CSN: [id number] Arrival date and time: 06/07/17 2542  First Provider Initiated Contact with Patient 06/07/17 2043      Chief Complaint  Patient presents with  . Emesis    HPI: Sally Williams is a 31 y.o. nonpregnant female to maternity admissions reporting lower abdominal pain and vomiting. Reports history of chronic pelvic pain, endometriosis and uterine fibroids coming in for abdominal pain and vomiting. She reports her endometriosis was previously controlled on pill, but stopped taking this recently to attempt to get pregnant. Reports being on toradol po for pain control, but pain uncontrolled and severe since this afternoon. Also reports intermittent vomiting with her pain. Endorses nausea now. Denies diarrhea or constipation. Last BM was this morning. She is passing flatus. LMP was 05/25/17. Denies abnormal vaginal discharged. Also denies urinary symptoms, fever or chills. She is followed by an endometriosis specialist in [place].    OB History  Gravida Para Term Preterm AB Living  0 0 0 0 0 0  SAB TAB Ectopic Multiple Live Births  0 0 0 0 0       Past Medical History:  Diagnosis Date  . Dysmenorrhea   . Elevated liver enzymes 04/03/2017  . Endometriosis determined by laparoscopy 02/02/2017  . Fibroid   . GERD (gastroesophageal reflux disease)   . Headache   . PUD (peptic ulcer disease) 2014  . Spinal headache   . Ulcer    Past Surgical History:  Procedure Laterality Date  . LAPAROSCOPY N/A 07/27/2016   Procedure: LAPAROSCOPY DIAGNOSTIC;  Surgeon: Emily Filbert, MD;  Location: [facility];  Service: Gynecology;  Laterality: N/A;  . NO PAST SURGERIES    . UPPER GI ENDOSCOPY     Social History   Socioeconomic History  . Marital status: Married    Spouse name: Harrell Lark  . Number of children: 0  . Years of education: MBA  . Highest education level: Not on file  Social Needs  . Financial resource strain: Not on file  . Food insecurity - worry: Not on file  .  Food insecurity - inability: Not on file  . Transportation needs - medical: Not on file  . Transportation needs - non-medical: Not on file  Occupational History  . Occupation: housewife    Comment: paperwork for an [place] Use  . Smoking status: Never Smoker  . Smokeless tobacco: Never Used  Substance and Sexual Activity  . Alcohol use: No  . Drug use: No  . Sexual activity: Yes    Birth control/protection: Condom  Other Topics Concern  . Not on file  Social History Narrative   Originally from [place]   Moved to [place]. To be with husband, who is getting his PhD in Engineer, drilling at [employer] at home with husband.   No Known Allergies  Medications Prior to Admission  Medication Sig Dispense Refill Last Dose  . famotidine (PEPCID) 20 MG tablet 2 tabs by mouth at bedtime 60 tablet 4   . ibuprofen (ADVIL,MOTRIN) 100 MG/5ML suspension 10-40 ml by mouth every 6 hours with food for pain (Patient not taking: Reported on 04/03/2017)   Not Taking  . triamcinolone cream (KENALOG) 0.5 % Apply 1 application topically 2 (two) times daily. 45 g 1   . ULIPRISTAL ACETATE PO Take 5 mg by mouth at bedtime.   Taking    I have reviewed patient's Past Medical Hx, Surgical Hx, Family Hx, Social Hx, medications and allergies.   Review of  Systems: Negative except for what is mentioned in HPI.  Physical Exam   Blood pressure (!) 141/86, pulse 92, temperature 98.5 F (36.9 C), temperature source Oral, resp. rate 20, height 4\' 10"  (1.473 m), weight 116 lb 8 oz (52.8 kg).  Constitutional: Well-developed, well-nourished female. Initially moving around in bed in pain.  HENT: [place]/AT, normal oropharynx mucosa. MMM Eyes: normal conjunctivae, no scleral icterus Cardiovascular: normal rate, regular rhythm Respiratory: normal effort, lungs CTAB.  GI: Abd soft, non-distended, diffuse mild TTP, no rebound or guarding' ormoactive bowel sounds GU: Neg CVAT bilaterally Pelvic: NEFG. Normal  vaginal mucosa without lesions; cervix pink, visually closed, without lesion; no vaginal discharge. Neg CMT, uterus nontender, nonenlarged, adnexa without tenderness, enlargement, or mass MSK: Extremities nontender, no edema Neurologic: Alert and oriented x 4. Psych: Normal mood and affect Skin: warm and dry    MAU Course/MDM:   Nursing notes and VS reviewed. VS wnl. Pt hemodynamically stable.  Patient seen and examined, as noted above.   Toradol IM given for pain Zofran ODT for nausea.  Pelvic cultures collected. CBC, CMP, Lipase ordered.   Results reviewed:  UPT negative, UA negative CMP, wnl other mildly elevated bili (1.3), with LFTs improved from prior (elevated a few months ago) CBC with mildly elevated WBC 11.2, otherwise normal.  Wet prep with few WBCs, otherwise normal  Repeat abdominal exam, nontender, and remains soft .   Assessment and Plan  Assessment: 1. Pelvic pain   2. Endometriosis   3. Pregnancy examination or test, negative result     Plan: --D/c home on scheduled NSAIDs, tramadol prn (#10 prescribed), and phenergan for N/V --Follow up with her endometriosis specialist. She also has an appt with her PCP tomorrow --Discharge home in stable condition.  --Return precautions  Jessiah Wojnar, Jenne Pane, MD 06/07/2017 11:44 PM

## 2017-06-08 ENCOUNTER — Ambulatory Visit: Payer: Self-pay | Admitting: Internal Medicine

## 2017-06-08 ENCOUNTER — Encounter: Payer: Self-pay | Admitting: Internal Medicine

## 2017-06-08 VITALS — BP 112/78 | HR 62 | Resp 12 | Ht <= 58 in | Wt 119.0 lb

## 2017-06-08 DIAGNOSIS — N809 Endometriosis, unspecified: Secondary | ICD-10-CM

## 2017-06-08 DIAGNOSIS — R102 Pelvic and perineal pain: Secondary | ICD-10-CM

## 2017-06-08 NOTE — Patient Instructions (Addendum)
Call over holiday if increased pain not responding to current medications of Meloxicam, Tramadol for severe pain and Phenergan for nausea. Stay on top of nausea with phenergan and then take bland food and either Meloxicam or for severe pain, Tramadol with the food Take Folic Acid 1 mg daily if unable to take prenatal vitamin.

## 2017-06-08 NOTE — Progress Notes (Signed)
   Subjective:    Patient ID: Sally Williams, female    DOB: 31-Mar-1986, 31 y.o.   MRN: [id number]  HPI Endometriosis and pelvic pain:  Patient has been back to [facility] where she has two charts, neither of which are coming through now on [place] as they are to be merged under [place], hopefully soon. Decision was agreed upon to not utilize Uripristal due to interest in pregnancy and elevated liver enzymes while taking, which resolved once she came off.  Patient describes undergoing a hysterosalpingogram, which apparently showed at least one of the tubes not patent (they feel likely the left). Discussed with [facility] provider possibility of spasm of tube due to the Uripristal and would need 6 months to see if this resolves. She was given Toradol, unknown strength to utilize for the pain. Patient did not not have period since stopping the hormones until 12.17.18.  Her last flow was on this past Monday, the 24th.  She describes burning pain in left pelvic area starting on the 18th for which she was taking Toradol 3 times daily.  Yesterday, the pain was worse and she vomited, so was unable to continue taking the Toradol.  She states the Toradol decreased the pain minimally.   WEnt to ED yesterday around 7 p.m. And discharged at 12:30 am this morning. Was given Rx for Meloxicam 15 mg daily, Phenergan 25 mg every 8 hours as needed, and Tramadol 50 mg every 8 hours as needed. Did take a Tramadol at 8 a.m. And feels she still has some pain--a "2" on a scale of 0-10.  When went into ED scored a "10" on pain scale, so pain is improved..  She is not taking a PNV--causes nausea even when taking before bedtime. Current Meds  Medication Sig  . promethazine (PHENERGAN) 25 MG tablet Take 1 tablet (25 mg total) by mouth every 8 (eight) hours as needed for nausea or vomiting.  . traMADol (ULTRAM) 50 MG tablet Take 1 tablet (50 mg total) by mouth every 8 (eight) hours as needed.  . triamcinolone cream  (KENALOG) 0.5 % Apply 1 application topically 2 (two) times daily.    No Known Allergies  Review of Systems     Objective:   Physical Exam  NAD Lungs:  CTA CV: RRR without murmur or rub, radial pulses normal and equal. Abd:  S, + BS.  Did not palpate significantly as evaluated earlier in ED today and avoiding exacerbating her discomfort.        Assessment & Plan:  Endometriosis with recurrent pelvic pain not responding to Toradol. Goal ultimately is for pregnancy, so would like to remain off BCPs to treat pain.  To utilize Tramadol only for severe pain and use Meloxicam regularly for now until pain improves, then may discontinue. To utilize Phenergan first thing in morning prior to attempting breakfast and NSAID. To start Folic Acid 1 mg daily as not tolerating PNV  Call into [facility].   She saw Dr. Lesia Sago [facility]), but she is not in today.  [phone]. Sally Pastor, PA to return call. She did, but unable to get to phone and number gets me back to just scheduler again. I did leave a message for Dr. Quentin Williams to see about getting her in sooner due to increased pain. Will try again after holidays.

## 2017-06-11 LAB — GC/CHLAMYDIA PROBE AMP (~~LOC~~) NOT AT ARMC
Chlamydia: NEGATIVE
Neisseria Gonorrhea: NEGATIVE

## 2017-06-19 ENCOUNTER — Telehealth: Payer: Self-pay

## 2017-06-19 NOTE — Telephone Encounter (Signed)
Patient called stating Dr. Amil Amen was suppose to reach out to Dr. Quentin Cornwall and get patient scheduled for earlier appointment. States she had not heard anything back from Dr. Amil Amen. Per Dr. Amil Amen she spoke with Dr. Quentin Cornwall last week and no need for earlier appointment and for patient to stop meloxicam if she is still trying to conceive and to have patient control her pain with Ultram or tylenol. Per Dr. Amil Amen if patient is still in pain can in crease her ultram.  Per patient her pain has subsided. Patient had been taking meloxicam daily in the morning. Informed patient to stop meloxicam and take tylenol, if needed and to avoid ibuprofen and naproxen. Patient states that her pain is controlled with current dose of ultram. Informed patient to let us know if she is having uncontrolled pain and we can increase dose of ultram. Patient verbalized understanding. Also informed to keep her appointment with Dr. Quentin Cornwall.  To dr. Amil Amen for [facility]

## 2017-06-20 ENCOUNTER — Other Ambulatory Visit: Payer: Self-pay | Admitting: Internal Medicine

## 2017-06-20 NOTE — Telephone Encounter (Signed)
I did have a late phone conversation with Dr. Quentin Cornwall, OB/GYN, at [facility] who agreed with Tramadol/Ultram for pain and to avoid the NSAIDS with the theoretical concerns for decreasing ability to get pregnant.   May titrate Tramadol up as needed.   I did discuss also she was not tolerating PNV and was to switch to the Folic Acid 1 mg daily. She did not need to see her sooner unless pain not controlled with Tramadol.   I called and spoke with Daijha about all of above this morning.  She stated she understood and did not have any questions. She is currently without pain and will start the Tramadol early with pain and notify clinic if not controlling the pain.  She does not drive, but encouraged her to get an idea of how the pain medication makes her feel before she is involved in anything requiring her to be alert.

## 2017-06-20 NOTE — Progress Notes (Signed)
See telephone call dated 06/19/2017:  Stopping all NSAIDS to decrease her risk for inability to get pregnant.

## 2017-06-20 NOTE — Telephone Encounter (Signed)
Conversation with Dr. Quentin Cornwall was the evening of June 11, 2017

## 2017-06-26 NOTE — Telephone Encounter (Signed)
Entered in error

## 2017-06-27 ENCOUNTER — Telehealth: Payer: Self-pay

## 2017-06-27 DIAGNOSIS — N809 Endometriosis, unspecified: Secondary | ICD-10-CM

## 2017-06-27 NOTE — Telephone Encounter (Signed)
Patient called stating her menstrual cycle started on 06/24/17 and she is having a lot of pain and nausea, vomiting and headache. Patient asking for increase in Tramadol to be called in to [facility].  To Dr. Amil Amen for further direction.

## 2017-06-28 MED ORDER — TRAMADOL HCL 50 MG PO TABS: ORAL_TABLET | ORAL | 0 refills | Status: AC

## 2017-06-28 NOTE — Telephone Encounter (Signed)
Spoke with patient. Informed medication has been sent to pharmacy and if pain gets worse then she needs to get sooner appointment at [facility]. Patient verbalized understanding

## 2017-06-28 NOTE — Telephone Encounter (Signed)
Notify has been sent. She can take 1-2 of the same mg every 6 hours.  I gave her more.  I did not go up on the tablet strength just in case she had pain that required the Tramadol, but not severe enough to require the 100 mg. If this does not control her pain, then she needs to call [facility] for an appointment sooner than her currently scheduled followoup

## 2017-08-13 IMAGING — US US PELVIS COMPLETE
1 series · 15 of 25 positions shown · non-contrast
Comparison: None

CLINICAL DATA: 30 y/o F; dysmenorrhea, known fibroids, lower pelvic
pain severe for 1 week. Worse today.



[Series 1: us pelvis complete · 15 of 123 slices shown]
[im 1/123]
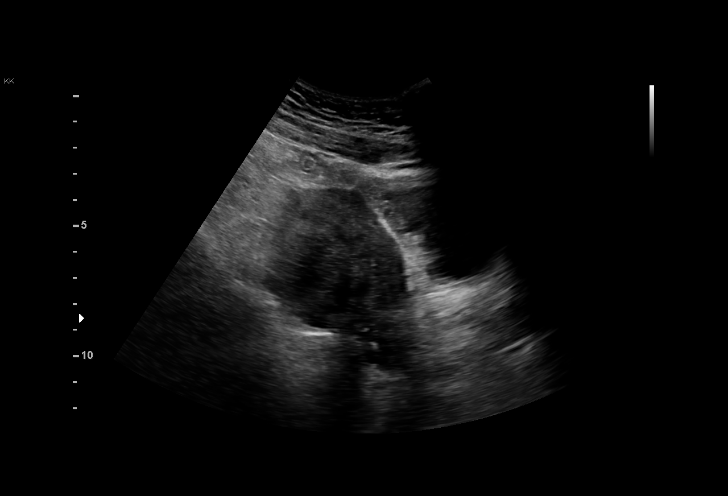
[im 11/123]
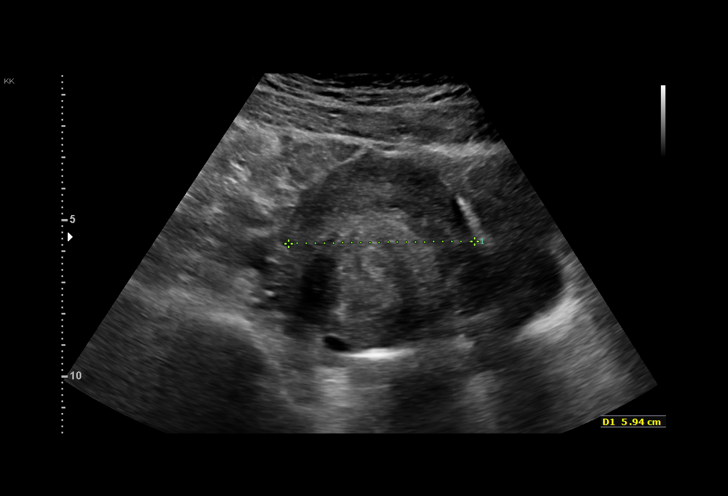
[im 21/123]
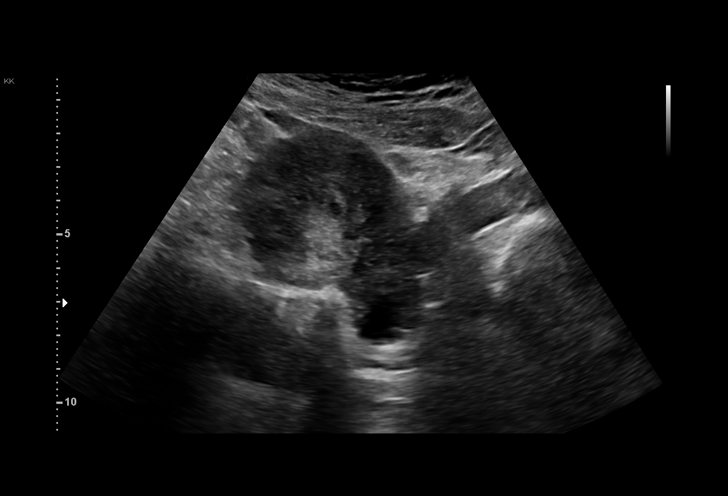
[im 26/123]
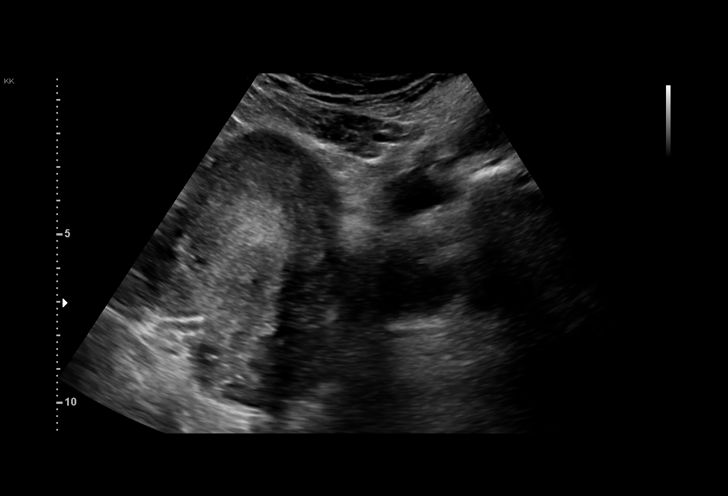
[im 36/123]
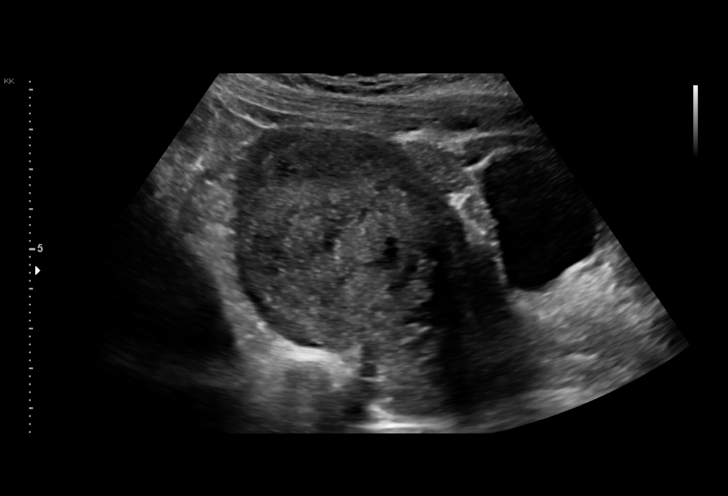
[im 46/123]
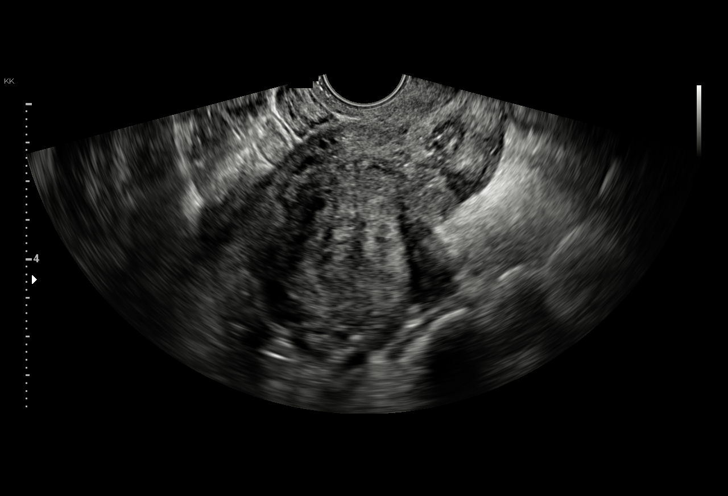
[im 51/123]
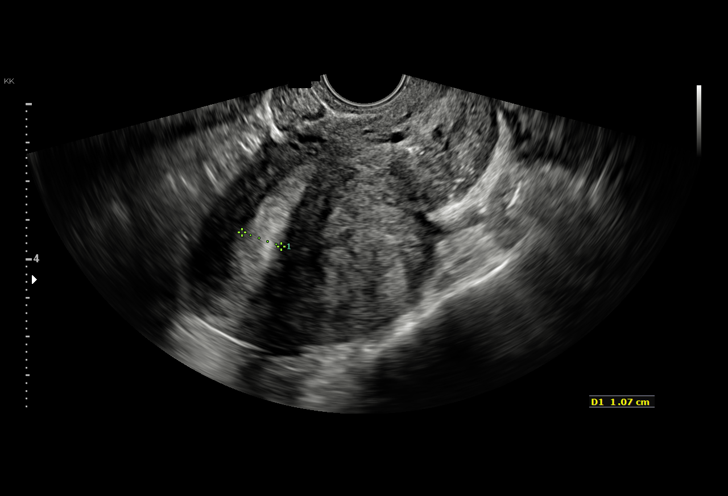
[im 62/123]
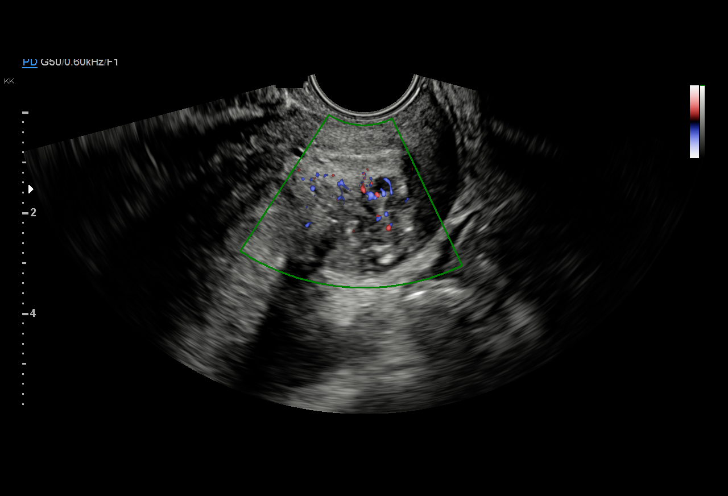
[im 72/123]
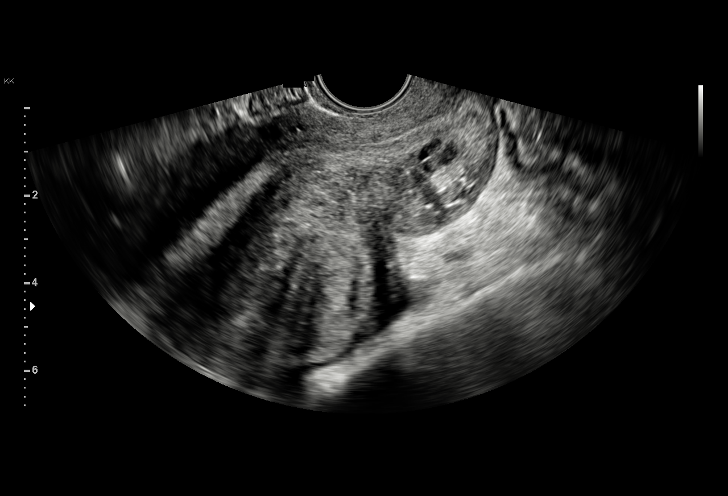
[im 77/123]
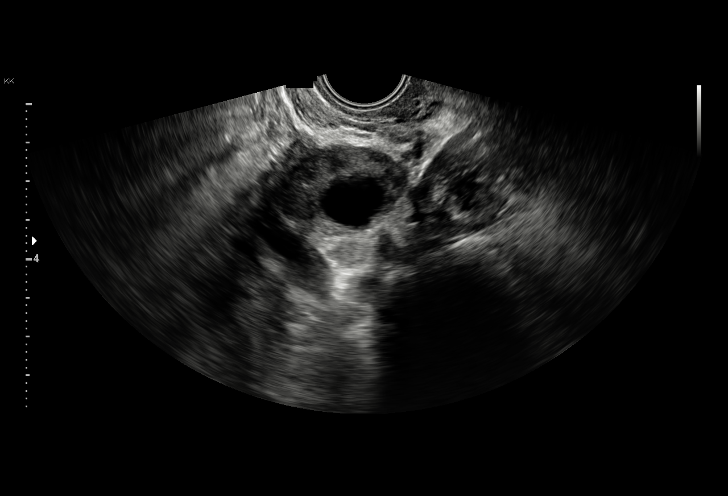
[im 87/123]
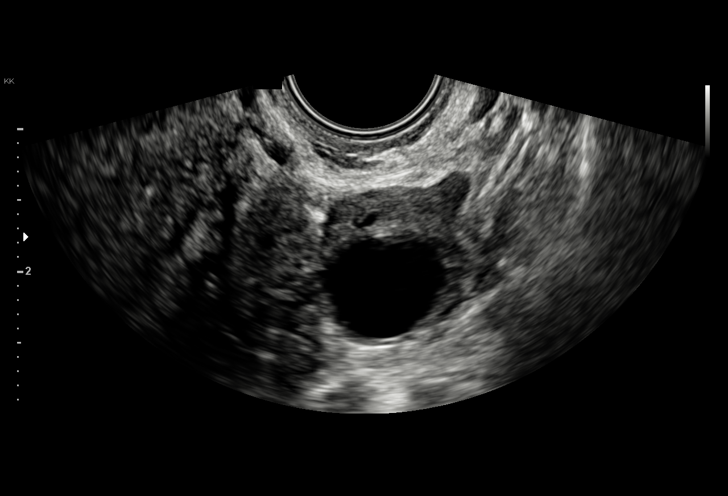
[im 97/123]
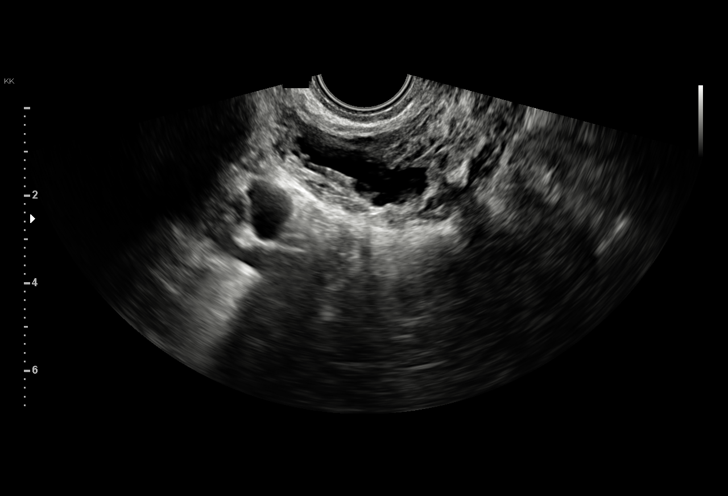
[im 102/123]
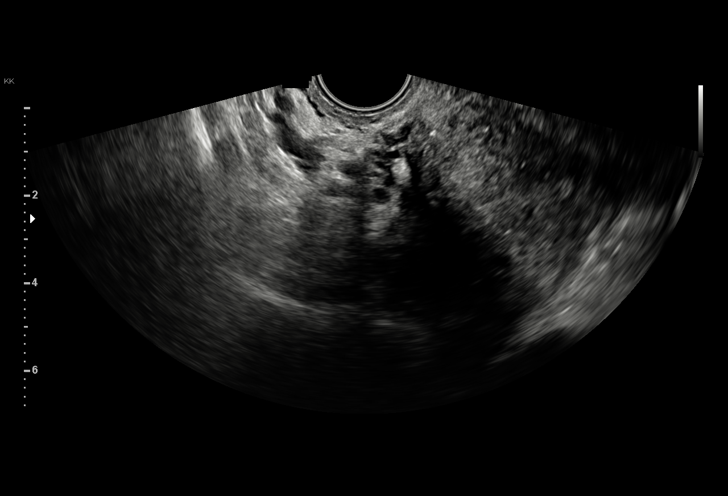
[im 112/123]
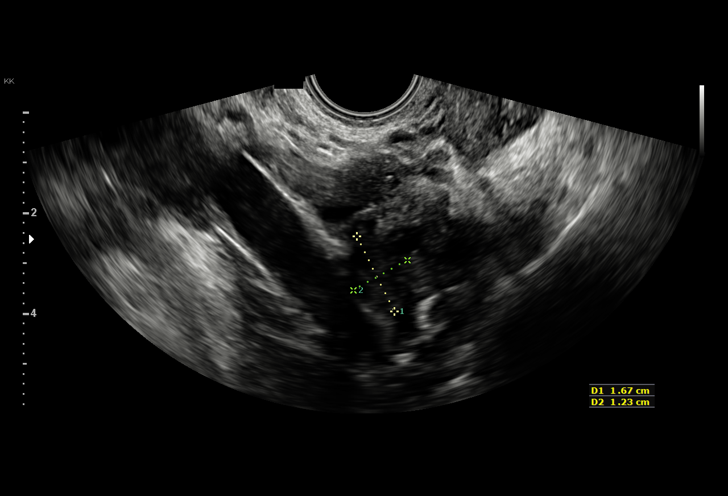
[im 123/123]
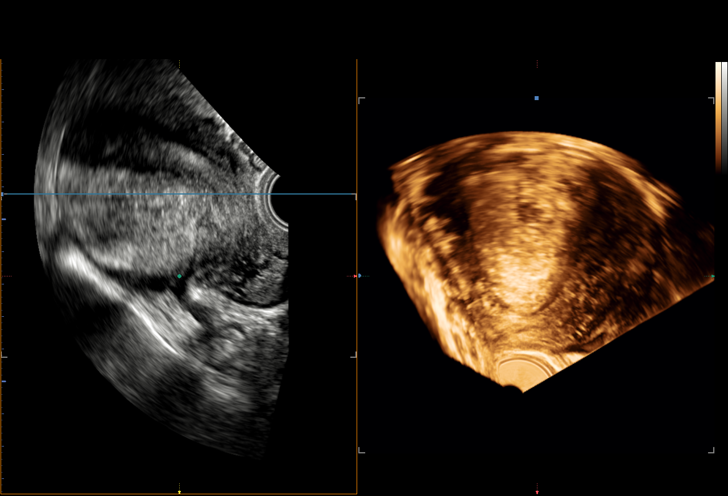

[15 of 25 positions shown; findings below may reference images not displayed]

FINDINGS: Uterus

Measurements: 9.7 x 5.6 x 5.4 cm. Posterior lower myometrial fibroma
measuring 1.4 x 1.2 x 1.4 cm. Posterior upper myometrial 4.3 x 3.5 x
3.8 cm fibroid. Subcentimeter cyst at the cervix, likely nabothian.

Endometrium

Thickness: 11 mm.  No focal abnormality visualized.

Right ovary

Measurements: 1.7 x 1.2 x 1.6 cm. Normal appearance/no adnexal mass.

Left ovary

Measurements: 2.6 x 2.3 x 2.0 cm. Normal appearance/no adnexal mass.
Benign left ovarian follicle for

Other findings

Trace pelvic fluid is likely physiologic.
IMPRESSION: No acute process identified.  Multiple uterine fibroids.

By: Hegoi Rf M.D.

## 2017-11-09 IMAGING — CT CT PELVIS W/ CM
1 series · 16 of 32 positions shown, 20 images · IV contrast (iopamidol)
Comparison: None.

CLINICAL DATA: Pelvic pain for 3 months.  Heavy vaginal bleeding.

EXAM:
CT PELVIS WITH CONTRAST
TECHNIQUE: Multidetector CT imaging of the pelvis was performed using the
standard protocol following the bolus administration of intravenous
contrast.
CONTRAST:  100mL HSL2RY-F77 IOPAMIDOL (HSL2RY-F77) INJECTION 61%

[Series 2: routine pelvis · axial · 0.74mm/px · z∈[-424,-214]mm · 16 of 47 slices shown, 20 images]
[im 3/47  soft-tissue]
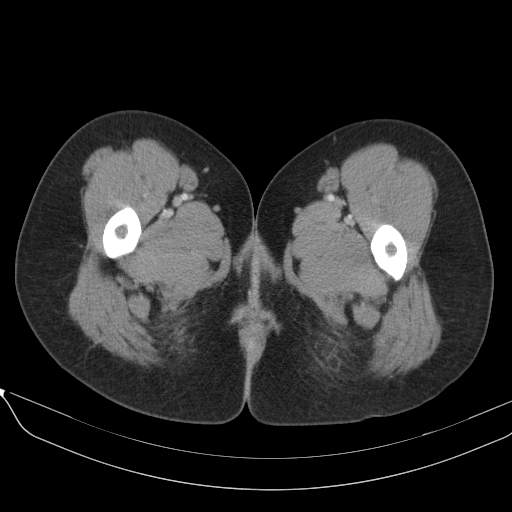
[im 3/47  bone]
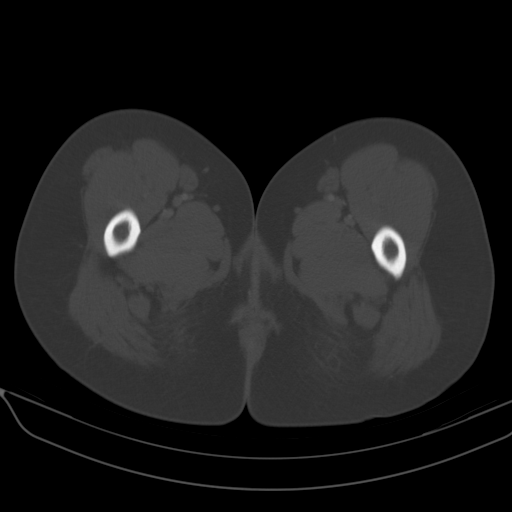
[im 6/47  soft-tissue]
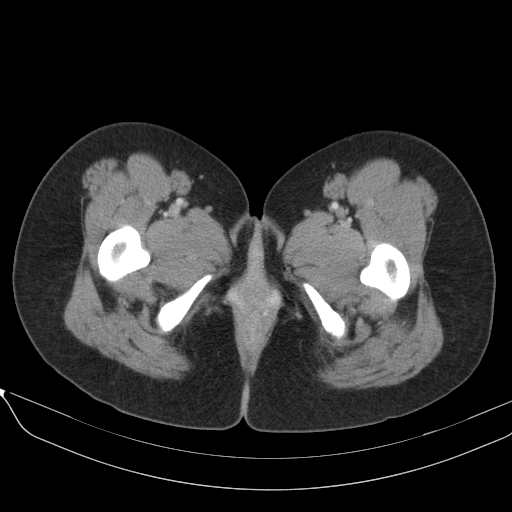
[im 9/47  soft-tissue]
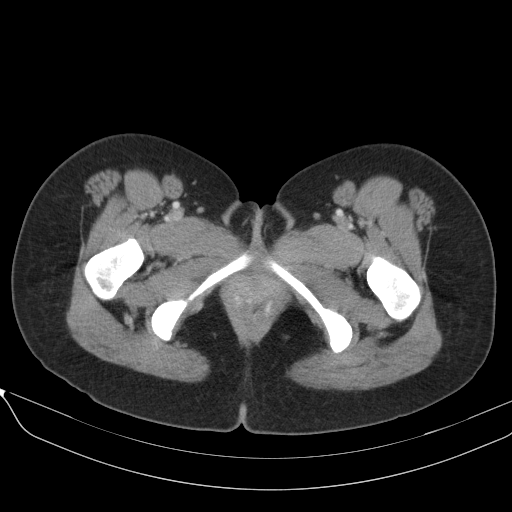
[im 12/47  soft-tissue]
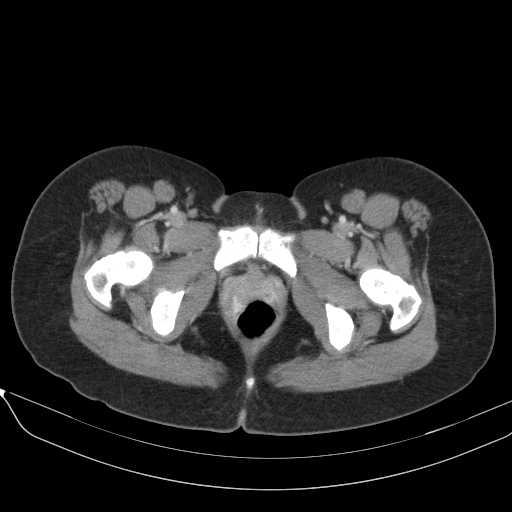
[im 15/47  soft-tissue]
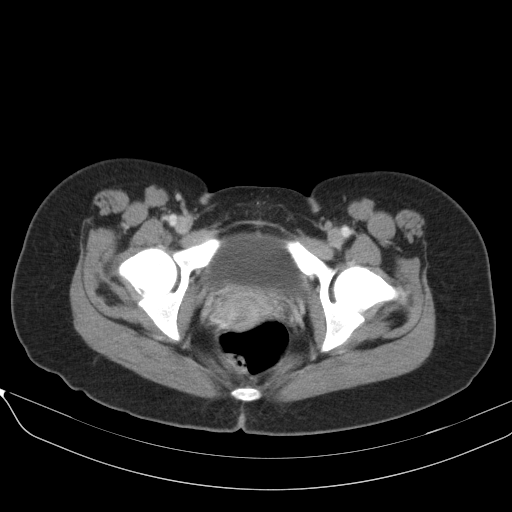
[im 18/47  soft-tissue]
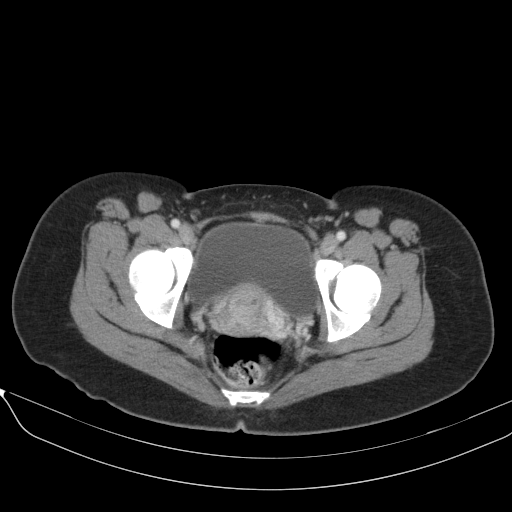
[im 21/47  soft-tissue]
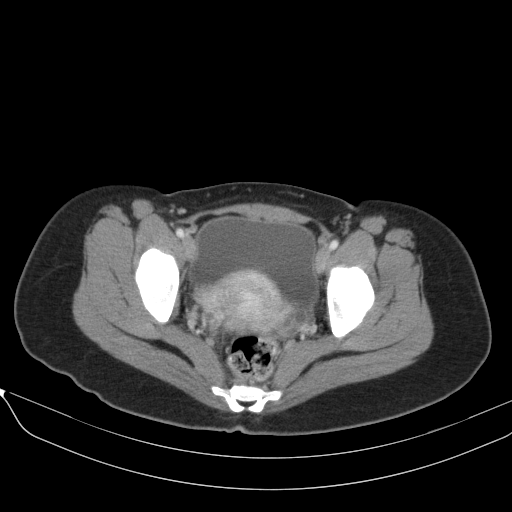
[im 26/47  soft-tissue]
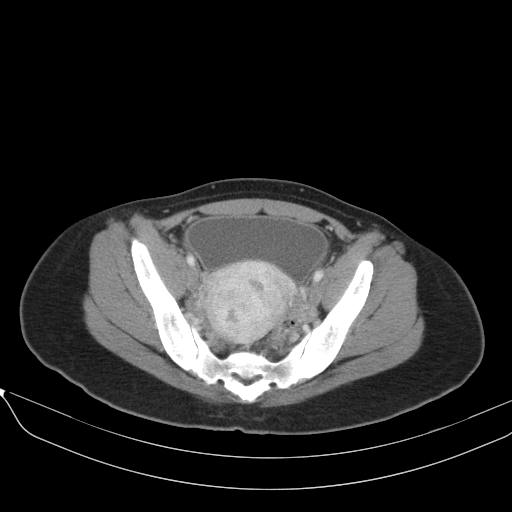
[im 29/47  soft-tissue]
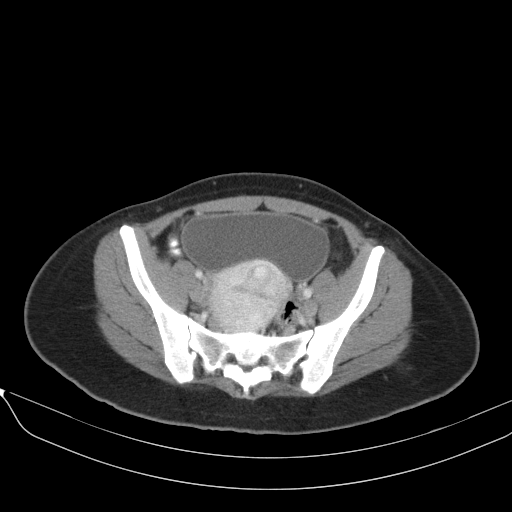
[im 29/47  bone]
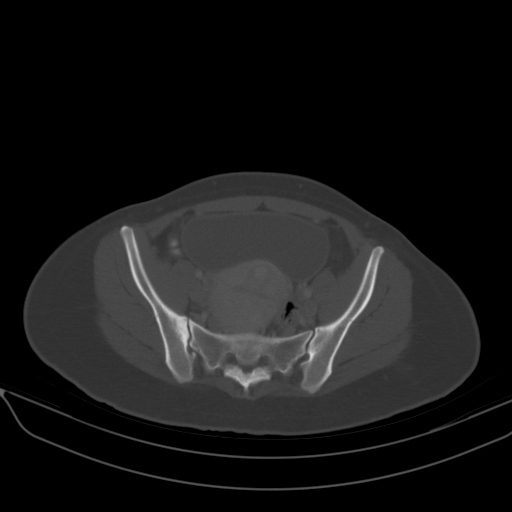
[im 32/47  soft-tissue]
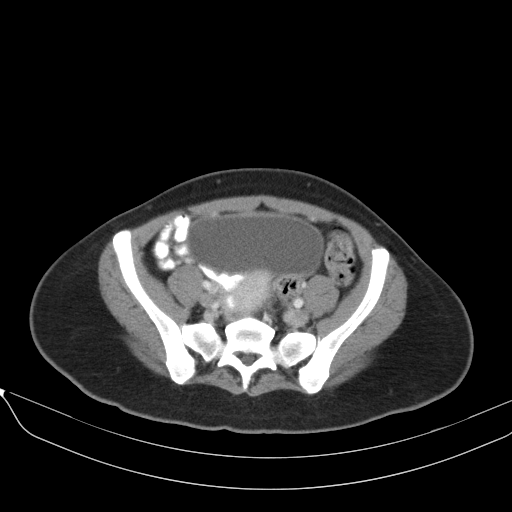
[im 35/47  soft-tissue]
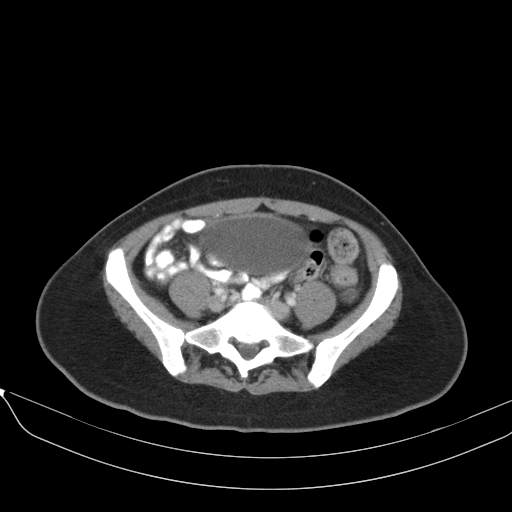
[im 38/47  soft-tissue]
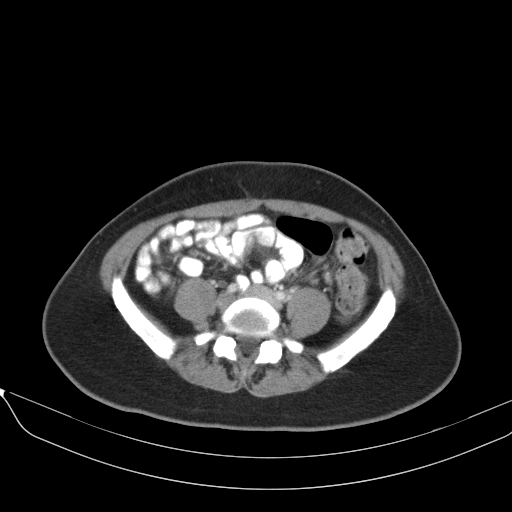
[im 41/47  soft-tissue]
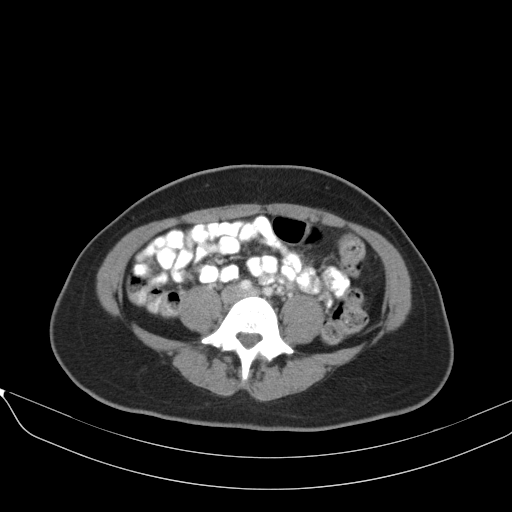
[im 41/47  lung]
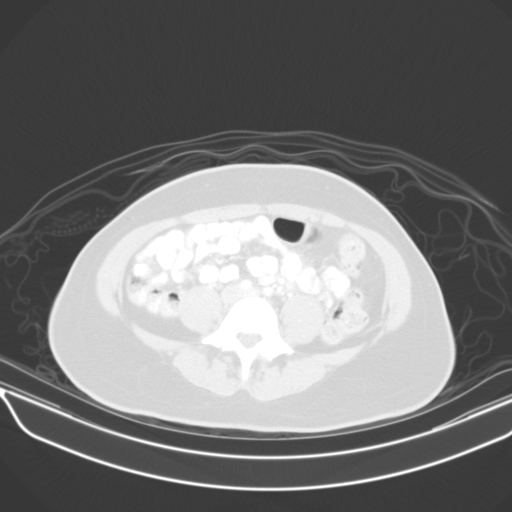
[im 42/47  lung]
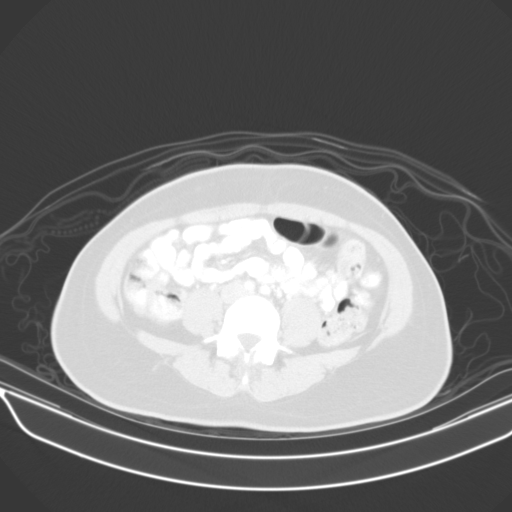
[im 44/47  soft-tissue]
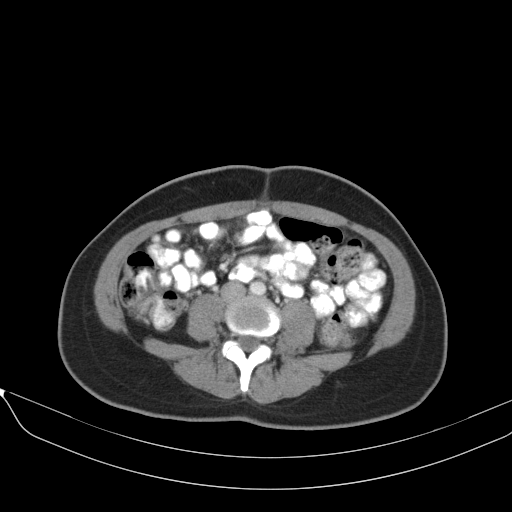
[im 44/47  lung]
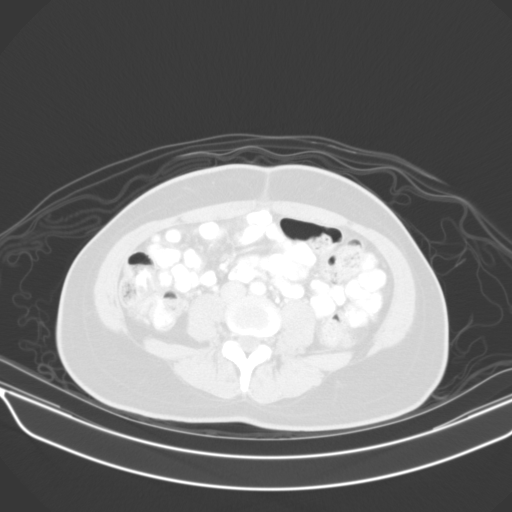
[im 45/47  lung]
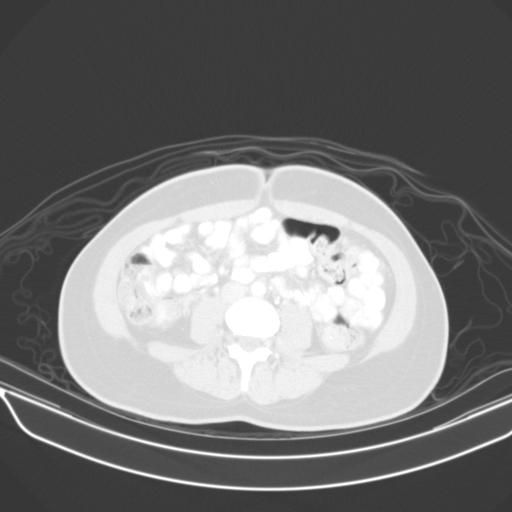

[16 of 32 positions shown; findings below may reference images not displayed]

FINDINGS: Urinary Tract:  Urinary bladder appears normal.

Bowel:  Unremarkable visualized pelvic bowel loops.

Vascular/Lymphatic: No pathologically enlarged lymph nodes. No
significant vascular abnormality seen.

Reproductive: Enlarged fibroid uterus is identified measuring 6.1 x
6.8 x 7.8 cm. Multiple fibroids are identified. The largest arises
from the posterior myometrium measuring approximately 4 cm. No
adnexal mass identified.

Other:  No free fluid or fluid collections.

Musculoskeletal: No aggressive lytic or sclerotic bone lesions.
IMPRESSION: 1. No acute findings.
2. Enlarged fibroid uterus

## 2018-01-31 ENCOUNTER — Inpatient Hospital Stay (HOSPITAL_COMMUNITY)
Admission: AD | Admit: 2018-01-31 | Discharge: 2018-01-31 | Disposition: A | Discharge status: Home / Self Care | Payer: Self-pay | Source: Ambulatory Visit | Attending: Obstetrics and Gynecology | Admitting: Obstetrics and Gynecology

## 2018-01-31 ENCOUNTER — Encounter (HOSPITAL_COMMUNITY): Payer: Self-pay | Admitting: *Deleted

## 2018-01-31 ENCOUNTER — Other Ambulatory Visit: Payer: Self-pay

## 2018-01-31 DIAGNOSIS — R102 Pelvic and perineal pain: Secondary | ICD-10-CM

## 2018-01-31 DIAGNOSIS — R109 Unspecified abdominal pain: Secondary | ICD-10-CM | POA: Insufficient documentation

## 2018-01-31 DIAGNOSIS — R112 Nausea with vomiting, unspecified: Secondary | ICD-10-CM | POA: Insufficient documentation

## 2018-01-31 DIAGNOSIS — G8929 Other chronic pain: Secondary | ICD-10-CM

## 2018-01-31 DIAGNOSIS — R030 Elevated blood-pressure reading, without diagnosis of hypertension: Secondary | ICD-10-CM | POA: Insufficient documentation

## 2018-01-31 LAB — CBC WITH DIFFERENTIAL/PLATELET
Basophils Absolute: 0 10*3/uL (ref 0.0–0.1)
Basophils Relative: 0 %
EOS PCT: 0 %
Eosinophils Absolute: 0 10*3/uL (ref 0.0–0.7)
HCT: 35.5 % — ABNORMAL LOW (ref 36.0–46.0)
HEMOGLOBIN: 12.4 g/dL (ref 12.0–15.0)
LYMPHS ABS: 1.2 10*3/uL (ref 0.7–4.0)
LYMPHS PCT: 11 %
MCH: 29.4 pg (ref 26.0–34.0)
MCHC: 34.9 g/dL (ref 30.0–36.0)
MCV: 84.1 fL (ref 78.0–100.0)
MONOS PCT: 4 %
Monocytes Absolute: 0.5 10*3/uL (ref 0.1–1.0)
Neutro Abs: 9.6 10*3/uL — ABNORMAL HIGH (ref 1.7–7.7)
Neutrophils Relative %: 85 %
PLATELETS: 314 10*3/uL (ref 150–400)
RBC: 4.22 MIL/uL (ref 3.87–5.11)
RDW: 14.3 % (ref 11.5–15.5)
WBC: 11.3 10*3/uL — AB (ref 4.0–10.5)

## 2018-01-31 LAB — RAPID URINE DRUG SCREEN, HOSP PERFORMED
AMPHETAMINES: NOT DETECTED
Barbiturates: NOT DETECTED
Benzodiazepines: NOT DETECTED
Cocaine: NOT DETECTED
Opiates: NOT DETECTED
TETRAHYDROCANNABINOL: NOT DETECTED

## 2018-01-31 LAB — URINALYSIS, ROUTINE W REFLEX MICROSCOPIC
BILIRUBIN URINE: NEGATIVE
Glucose, UA: NEGATIVE mg/dL
HGB URINE DIPSTICK: NEGATIVE
Ketones, ur: 20 mg/dL — AB
Leukocytes, UA: NEGATIVE
Nitrite: NEGATIVE
Protein, ur: NEGATIVE mg/dL
Specific Gravity, Urine: 1.015 (ref 1.005–1.030)
pH: 5 (ref 5.0–8.0)

## 2018-01-31 LAB — POCT PREGNANCY, URINE: PREG TEST UR: NEGATIVE

## 2018-01-31 MED ORDER — ONDANSETRON 8 MG PO TBDP
8.0000 mg | ORAL_TABLET | Freq: Once | ORAL | Status: AC
  Administered 2018-01-31: 8 mg via ORAL
  Filled 2018-01-31: qty 1

## 2018-01-31 MED ORDER — ONDANSETRON HCL 4 MG PO TABS: 4.0000 mg | ORAL_TABLET | Freq: Four times a day (QID) | ORAL | 0 refills | Status: AC

## 2018-01-31 MED ORDER — KETOROLAC TROMETHAMINE 60 MG/2ML IM SOLN
60.0000 mg | Freq: Once | INTRAMUSCULAR | Status: AC
  Administered 2018-01-31: 60 mg via INTRAMUSCULAR
  Filled 2018-01-31: qty 2

## 2018-01-31 NOTE — MAU Provider Note (Signed)
History     CSN: [id number]  Arrival date and time: 01/31/18 1503   First Provider Initiated Contact with Patient 01/31/18 1607      Chief Complaint  Patient presents with  . Abdominal Pain  . Back Pain  . Joint Pain   Sally Williams is a 32 year-old G0P0 whp presents with abdominal and pelvic pain. She has been having ongoing pain similar to what she complains of today for years due to endometriosis. However, she says that her pain today is especially bad. She describes it as cramping pain. Her pain is generalized to the abdominal region and does not localize. She reports a history of operative management of her endometriosis but that they operation did not remove all disease. She takes Tylenol, ibuprofen, Robaxin, and Gabapentin but gets little relief from these. She often vomits after taking her pain medications. She also has 2-3 loose stools per day that she thinks are caused by ibuprofen. She denies pain with urination or blood in her urine or stool.  She has been seen by physicians at [facility] for her pain as recently as today. She reports that the physician who she saw today provided acupuncture therapy and prescribed CBD oil. She plans on acupuncture therapy weekly going forward and is optimistic about how these new treatments may help her pain.  She has never been pregnant but is currently trying to conceive.  OB History    Gravida  0   Para  0   Term  0   Preterm  0   AB  0   Living  0     SAB  0   TAB  0   Ectopic  0   Multiple  0   Live Births  0           Past Medical History:  Diagnosis Date  . Dysmenorrhea   . Elevated liver enzymes 04/03/2017  . Endometriosis determined by laparoscopy 02/02/2017  . Fibroid   . GERD (gastroesophageal reflux disease)   . Headache   . PUD (peptic ulcer disease) 2014  . Spinal headache   . Ulcer     Past Surgical History:  Procedure Laterality Date  . LAPAROSCOPY N/A 07/27/2016   Procedure: LAPAROSCOPY  DIAGNOSTIC;  Surgeon: Emily Filbert, MD;  Location: [facility];  Service: Gynecology;  Laterality: N/A;  . NO PAST SURGERIES    . UPPER GI ENDOSCOPY      Family History  Problem Relation Age of Onset  . Arthritis Mother   . Heart disease Father   . Diabetes Father   . Stroke Father     Social History   Tobacco Use  . Smoking status: Never Smoker  . Smokeless tobacco: Never Used  Substance Use Topics  . Alcohol use: No  . Drug use: No    Allergies: No Known Allergies  Medications Prior to Admission  Medication Sig Dispense Refill Last Dose  . famotidine (PEPCID) 20 MG tablet 2 tabs by mouth at bedtime (Patient not taking: Reported on 06/08/2017) 60 tablet 4 Not Taking  . promethazine (PHENERGAN) 25 MG tablet Take 1 tablet (25 mg total) by mouth every 8 (eight) hours as needed for nausea or vomiting. 20 tablet 0 Taking  . traMADol (ULTRAM) 50 MG tablet 1-2 tabs by mouth every 6 hours as needed for pain 60 tablet 0   . triamcinolone cream (KENALOG) 0.5 % Apply 1 application topically 2 (two) times daily. 45 g 1 Taking  Review of Systems  Gastrointestinal: Positive for abdominal pain and vomiting. Negative for blood in stool, constipation and nausea.  Genitourinary: Negative for difficulty urinating, hematuria and vaginal bleeding.   Physical Exam   Blood pressure (!) 179/85, pulse (!) 55, temperature 98.4 F (36.9 C), temperature source Oral, resp. rate 20, weight 49.8 kg, last menstrual period 01/21/2018, SpO2 100 %.  Gen: Appears in pain and uncomfortable in bed, but no acute distress CV: Normal rate, regular rhythm, no LE edema Pulm: CTA bilaterally, no wheezes Abd: Soft, diffusely tender to palpation, no rebound or guarding  MAU Course   MDM Urine pregnancy ordered (negative), CBC ordered to evaluate WBC count (11.3), UDS ordered due to acute elevation in blood pressure since office visit this morning (negative) Given Toradol and Zofran in MAU. Discharged home in  stable condition.  Assessment and Plan  Sally Williams is a 32 year-old G0P0 with a history of endometriosis who presents with abdominal and pelvic pain. Labs unremarkable and exam not concerning for acute abdomen. Most likely that current pain is due to chronic endometriosis and possibly other chronic GI issue. Acutely treat pain today, but consistent outpatient follow-up will be necessary for ongoing care.  Pelvic/Abdominal Pain: - UA (normal) - Urine Pregnancy Test (negative) - CBC (unremarkable) - Given Toradol 60mg  IM in MAU  Nausea: - Given Zofran 8mg  in MAU - Zofran 4mg  PO Q6 hours prn  Elevated Blood Pressure: increase from 127/73 at this morning's appointment to 179/85 this afternoon. Possible acute elevation due to pain but also possible that this is due to ingestion or intoxication. - UDS negative  Isabel Caprice, MS3 01/31/2018, 4:14 PM

## 2018-01-31 NOTE — MAU Provider Note (Signed)
History     CSN: [id number]  Arrival date and time: 01/31/18 1503   First Provider Initiated Contact with Patient 01/31/18 1607      Chief Complaint  Patient presents with  . Abdominal Pain  . Back Pain  . Joint Pain   HPI [name] is a 32 y.o. Sally Williams female who presents to MAU today with complaint of pelvic pain. The patient states that this has been a chronic issue for her. She has had at least 2 pelvic surgeries for chronic pain and likely has endometriosis. She was controlled, but now desires pregnancy and is not on previous management. She denies bleeding, UTI symptoms, discharge or fever today. She had N/V associated with taking her pain medications at home, but otherwise denies acute GI symptoms. She was seen by [facility] today for evaluation of this chronic issue. They are starting therapy with CBD oil and acupuncture.   OB History    Gravida  0   Para  0   Term  0   Preterm  0   AB  0   Living  0     SAB  0   TAB  0   Ectopic  0   Multiple  0   Live Births  0           Past Medical History:  Diagnosis Date  . Dysmenorrhea   . Elevated liver enzymes 04/03/2017  . Endometriosis determined by laparoscopy 02/02/2017  . Fibroid   . GERD (gastroesophageal reflux disease)   . Headache   . PUD (peptic ulcer disease) 2014  . Spinal headache   . Ulcer     Past Surgical History:  Procedure Laterality Date  . LAPAROSCOPY N/A 07/27/2016   Procedure: LAPAROSCOPY DIAGNOSTIC;  Surgeon: Emily Filbert, MD;  Location: [facility];  Service: Gynecology;  Laterality: N/A;  . NO PAST SURGERIES    . UPPER GI ENDOSCOPY      Family History  Problem Relation Age of Onset  . Arthritis Mother   . Heart disease Father   . Diabetes Father   . Stroke Father     Social History   Tobacco Use  . Smoking status: Never Smoker  . Smokeless tobacco: Never Used  Substance Use Topics  . Alcohol use: No  . Drug use: No    Allergies: No Known Allergies  No  medications prior to admission.    Review of Systems  Constitutional: Negative for fever.  Gastrointestinal: Positive for abdominal pain and nausea. Negative for constipation, diarrhea and vomiting.  Genitourinary: Positive for pelvic pain. Negative for dysuria, frequency, urgency, vaginal bleeding and vaginal discharge.   Physical Exam   Blood pressure (!) 158/71, pulse (!) 56, temperature 98.4 F (36.9 C), temperature source Oral, resp. rate 20, weight 49.8 kg, last menstrual period 01/21/2018, SpO2 100 %.  Physical Exam  Nursing note and vitals reviewed. Constitutional: She is oriented to person, place, and time. She appears well-developed and well-nourished. No distress.  HENT:  Head: Normocephalic and atraumatic.  Cardiovascular: Normal rate.  Respiratory: Effort normal.  GI: Soft. She exhibits no distension and no mass. There is tenderness (mild tenderness to palpation of the suprapubic region at midline). There is no rebound and no guarding.  Neurological: She is alert and oriented to person, place, and time.  Skin: Skin is warm and dry. No erythema.  Psychiatric: She has a normal mood and affect.    Results for orders placed or performed during the  hospital encounter of 01/31/18 (from the past 24 hour(s))  CBC with Differential/Platelet     Status: Abnormal   Collection Time: 01/31/18  4:38 PM  Result Value Ref Range   WBC 11.3 (H) 4.0 - 10.5 K/uL   RBC 4.22 3.87 - 5.11 MIL/uL   Hemoglobin 12.4 12.0 - 15.0 g/dL   HCT 35.5 (L) 36.0 - 46.0 %   MCV 84.1 78.0 - 100.0 fL   MCH 29.4 26.0 - 34.0 pg   MCHC 34.9 30.0 - 36.0 g/dL   RDW 14.3 11.5 - 15.5 %   Platelets 314 150 - 400 K/uL   Neutrophils Relative % 85 %   Neutro Abs 9.6 (H) 1.7 - 7.7 K/uL   Lymphocytes Relative 11 %   Lymphs Abs 1.2 0.7 - 4.0 K/uL   Monocytes Relative 4 %   Monocytes Absolute 0.5 0.1 - 1.0 K/uL   Eosinophils Relative 0 %   Eosinophils Absolute 0.0 0.0 - 0.7 K/uL   Basophils Relative 0 %    Basophils Absolute 0.0 0.0 - 0.1 K/uL  Urinalysis, Routine w reflex microscopic     Status: Abnormal   Collection Time: 01/31/18  4:52 PM  Result Value Ref Range   Color, Urine YELLOW YELLOW   APPearance CLEAR CLEAR   Specific Gravity, Urine 1.015 1.005 - 1.030   pH 5.0 5.0 - 8.0   Glucose, UA NEGATIVE NEGATIVE mg/dL   Hgb urine dipstick NEGATIVE NEGATIVE   Bilirubin Urine NEGATIVE NEGATIVE   Ketones, ur 20 (A) NEGATIVE mg/dL   Protein, ur NEGATIVE NEGATIVE mg/dL   Nitrite NEGATIVE NEGATIVE   Leukocytes, UA NEGATIVE NEGATIVE  Urine rapid drug screen (hosp performed)     Status: None   Collection Time: 01/31/18  4:52 PM  Result Value Ref Range   Opiates NONE DETECTED NONE DETECTED   Cocaine NONE DETECTED NONE DETECTED   Benzodiazepines NONE DETECTED NONE DETECTED   Amphetamines NONE DETECTED NONE DETECTED   Tetrahydrocannabinol NONE DETECTED NONE DETECTED   Barbiturates NONE DETECTED NONE DETECTED  Pregnancy, urine POC     Status: None   Collection Time: 01/31/18  4:59 PM  Result Value Ref Range   Preg Test, Ur NEGATIVE NEGATIVE    MAU Course  Procedures None  MDM Patient had evaluation at [facility] today  UPT - negative CBC, UA, Urine drug screen 60 mg IM Toradol and ODT Zofran given Patient reports some improvement in pain Assessment and Plan  A: Chronic pelvic pain   P: Discharge home Rx for Zofran sent to patient's pharmacy  Continue previously prescribed pain medications Patient advised to follow-up with [facility] as planned for continued management  Patient may return to MAU as needed or if her condition were to change or worsen  Kerry Hough, PA-C 01/31/2018, 6:56 PM

## 2018-01-31 NOTE — MAU Note (Signed)
Hx of endometriosis. Is having bad cramping.  Went to [employer] acupuncture.  Has eaten, taken pain meds, vomited.  Ate and took meds again.  Still having cramping, back pain and joint pain.

## 2018-01-31 NOTE — Discharge Instructions (Signed)
Endometriosis Endometriosis is a condition in which the tissue that lines the uterus (endometrium) grows outside of its normal location. The tissue may grow in many locations close to the uterus, but it commonly grows on the ovaries, fallopian tubes, vagina, or bowel. When the uterus sheds the endometrium every menstrual cycle, there is bleeding wherever the endometrial tissue is located. This can cause pain because blood is irritating to tissues that are not normally exposed to it. What are the causes? The cause of endometriosis is not known. What increases the risk? You may be more likely to develop endometriosis if you:  Have a family history of endometriosis.  Have never given birth.  Started your period at age 10 or younger.  Have high levels of estrogen in your body.  Were exposed to a certain medicine (diethylstilbestrol) before you were born (in utero).  Had low birth weight.  Were born as a twin, triplet, or other multiple.  Have a BMI of less than 25. BMI is an estimate of body fat and is calculated from height and weight.  What are the signs or symptoms? Often, there are no symptoms of this condition. If you do have symptoms, they may:  Vary depending on where your endometrial tissue is growing.  Occur during your menstrual period (most common) or midcycle.  Come and go, or you may go months with no symptoms at all.  Stop with menopause.  Symptoms may include:  Pain in the back or abdomen.  Heavier bleeding during periods.  Pain during sex.  Painful bowel movements.  Infertility.  Pelvic pain.  Bleeding more than once a month.  How is this diagnosed? This condition is diagnosed based on your symptoms and a physical exam. You may have tests, such as:  Blood tests and urine tests. These may be done to help rule out other possible causes of your symptoms.  Ultrasound, to look for abnormal tissues.  An X-ray of the lower bowel (barium enema).  An  ultrasound that is done through the vagina (transvaginally).  CT scan.  MRI.  Laparoscopy. In this procedure, a lighted, pencil-sized instrument called a laparoscope is inserted into your abdomen through an incision. The laparoscope allows your health care provider to look at the organs inside your body and check for abnormal tissue to confirm the diagnosis. If abnormal tissue is found, your health care provider may remove a small piece of tissue (biopsy) to be examined under a microscope.  How is this treated? Treatment for this condition may include:  Medicines to relieve pain, such as NSAIDs.  Hormone therapy. This involves using artificial (synthetic) hormones to reduce endometrial tissue growth. Your health care provider may recommend using a hormonal form of birth control, or other medicines.  Surgery. This may be done to remove abnormal endometrial tissue. ? In some cases, tissue may be removed using a laparoscope and a laser (laparoscopic laser treatment). ? In severe cases, surgery may be done to remove the fallopian tubes, uterus, and ovaries (hysterectomy).  Follow these instructions at home:  Take over-the-counter and prescription medicines only as told by your health care provider.  Do not drive or use heavy machinery while taking prescription pain medicine.  Try to avoid activities that cause pain, including sexual activity.  Keep all follow-up visits as told by your health care provider. This is important. Contact a health care provider if:  You have pain in the area between your hip bones (pelvic area) that occurs: ? Before, during, or   after your period. ? In between your period and gets worse during your period. ? During or after sex. ? With bowel movements or urination, especially during your period.  You have problems getting pregnant.  You have a fever. Get help right away if:  You have severe pain that does not get better with medicine.  You have severe  nausea and vomiting, or you cannot eat without vomiting.  You have pain that affects only the lower, right side of your abdomen.  You have abdominal pain that gets worse.  You have abdominal swelling.  You have blood in your stool. This information is not intended to replace advice given to you by your health care provider. Make sure you discuss any questions you have with your health care provider. Document Released: 05/26/2000 Document Revised: 03/03/2016 Document Reviewed: 10/30/2015 Elsevier Interactive Patient Education  2018 Elsevier Inc.  

## 2018-09-08 ENCOUNTER — Encounter (HOSPITAL_COMMUNITY): Payer: Self-pay | Admitting: *Deleted

## 2018-09-08 ENCOUNTER — Other Ambulatory Visit: Payer: Self-pay

## 2018-09-08 ENCOUNTER — Emergency Department (HOSPITAL_COMMUNITY)
Admission: EM | Admit: 2018-09-08 | Discharge: 2018-09-08 | Disposition: A | Discharge status: Home / Self Care | Payer: Self-pay | Attending: Emergency Medicine | Admitting: Emergency Medicine

## 2018-09-08 DIAGNOSIS — R300 Dysuria: Secondary | ICD-10-CM | POA: Insufficient documentation

## 2018-09-08 DIAGNOSIS — Z79899 Other long term (current) drug therapy: Secondary | ICD-10-CM | POA: Insufficient documentation

## 2018-09-08 LAB — URINALYSIS, ROUTINE W REFLEX MICROSCOPIC
BILIRUBIN URINE: NEGATIVE
Glucose, UA: NEGATIVE mg/dL
Hgb urine dipstick: NEGATIVE
Ketones, ur: NEGATIVE mg/dL
Leukocytes,Ua: NEGATIVE
NITRITE: NEGATIVE
PH: 6 (ref 5.0–8.0)
Protein, ur: NEGATIVE mg/dL
SPECIFIC GRAVITY, URINE: 1.003 — AB (ref 1.005–1.030)

## 2018-09-08 LAB — PREGNANCY, URINE: Preg Test, Ur: NEGATIVE

## 2018-09-08 MED ORDER — PHENAZOPYRIDINE HCL 200 MG PO TABS: 200.0000 mg | ORAL_TABLET | Freq: Three times a day (TID) | ORAL | 0 refills | Status: AC | PRN

## 2018-09-08 NOTE — Discharge Instructions (Addendum)
Your urine was negative for infection. Make sure you are staying well hydrated with water.  Take pyridium as needed for pain.  Follow up with your OB/GYN tomorrow if you are still having difficulty urinating.  Return to the ER with any new, worsening, or concerning symptoms.

## 2018-09-08 NOTE — ED Triage Notes (Signed)
PT reports she had endometriosis and that causes her not to void . Pt reports today she has had difficult time voiding.

## 2018-09-08 NOTE — ED Provider Notes (Signed)
[facility] EMERGENCY DEPARTMENT Provider Note   CSN: [id number] Arrival date & time: 09/08/18  1523    History   Chief Complaint Chief Complaint  Patient presents with  . Dysuria    HPI Sally Williams is a 33 y.o. female presenting for evaluation of pain with urination.  Patient states the past day, she has been having dysuria.  Patient states this happens every month around her period, due to her endometriosis.  Patient states normally it improves when she stays better hydrated, but that has not been helping today.  Patient states she is having difficulty urinating, and the fact that she is urinating frequently, and not much is being excreted.  She went to [employer] and bought a medication for pain, she does not know the name.  Since taking this about 45 minutes ago, she reports improvement of her symptoms.  She has not taken anything else for her symptoms.  Patient states she has no other medical problems, takes multiple medications daily for her endometriosis.  Denies fevers, chills, nausea, vomiting, abdominal pain, vaginal discharge, abnormal bowel movements.     HPI  Past Medical History:  Diagnosis Date  . Dysmenorrhea   . Elevated liver enzymes 04/03/2017  . Endometriosis determined by laparoscopy 02/02/2017  . Fibroid   . GERD (gastroesophageal reflux disease)   . Headache   . PUD (peptic ulcer disease) 2014  . Spinal headache   . Ulcer     Patient Active Problem List   Diagnosis Date Noted  . Elevated liver enzymes 04/03/2017  . Acne 04/03/2017  . Endometriosis determined by laparoscopy 02/02/2017  . Gastroesophageal reflux disease 02/02/2017  . Anemia 05/22/2016  . Obesity 05/22/2016  . Chronic pelvic pain in female 05/22/2016  . Fibroids, intramural 05/01/2016  . PUD (peptic ulcer disease) 06/12/2012    Past Surgical History:  Procedure Laterality Date  . LAPAROSCOPY N/A 07/27/2016   Procedure: LAPAROSCOPY DIAGNOSTIC;  Surgeon: Emily Filbert, MD;  Location: [facility];  Service: Gynecology;  Laterality: N/A;  . NO PAST SURGERIES    . UPPER GI ENDOSCOPY       OB History    Gravida  0   Para  0   Term  0   Preterm  0   AB  0   Living  0     SAB  0   TAB  0   Ectopic  0   Multiple  0   Live Births  0            Home Medications    Prior to Admission medications   Medication Sig Start Date End Date Taking? Authorizing Provider  famotidine (PEPCID) 20 MG tablet 2 tabs by mouth at bedtime Patient not taking: Reported on 06/08/2017 04/03/17   Mack Hook, MD  ondansetron (ZOFRAN) 4 MG tablet Take 1 tablet (4 mg total) by mouth every 6 (six) hours. 01/31/18   Luvenia Redden, PA-C  phenazopyridine (PYRIDIUM) 200 MG tablet Take 1 tablet (200 mg total) by mouth 3 (three) times daily as needed for pain. 09/08/18   Kristena Wilhelmi, PA-C  promethazine (PHENERGAN) 25 MG tablet Take 1 tablet (25 mg total) by mouth every 8 (eight) hours as needed for nausea or vomiting. 06/07/17   Degele, Jenne Pane, MD  traMADol Veatrice Bourbon) 50 MG tablet 1-2 tabs by mouth every 6 hours as needed for pain 06/28/17   Mack Hook, MD  triamcinolone cream (KENALOG) 0.5 % Apply 1 application topically  2 (two) times daily. 04/03/17   Mack Hook, MD    Family History Family History  Problem Relation Age of Onset  . Arthritis Mother   . Heart disease Father   . Diabetes Father   . Stroke Father     Social History Social History   Tobacco Use  . Smoking status: Never Smoker  . Smokeless tobacco: Never Used  Substance Use Topics  . Alcohol use: No  . Drug use: No     Allergies   Patient has no known allergies.   Review of Systems Review of Systems  Genitourinary: Positive for decreased urine volume, dysuria and frequency.     Physical Exam Updated Vital Signs BP 137/75 (BP Location: Right Arm)   Pulse 73   Temp 98.4 F (36.9 C) (Oral)   Resp 16   Ht 4\' 8"  (1.422 m)   Wt 49.8 kg   LMP  08/27/2018   SpO2 100%   BMI 24.61 kg/m   Physical Exam Vitals signs and nursing note reviewed.  Constitutional:      General: She is not in acute distress.    Appearance: She is well-developed.     Comments: Appears nontoxic  HENT:     Head: Normocephalic and atraumatic.  Eyes:     Conjunctiva/sclera: Conjunctivae normal.     Pupils: Pupils are equal, round, and reactive to light.  Neck:     Musculoskeletal: Normal range of motion and neck supple.  Cardiovascular:     Rate and Rhythm: Normal rate and regular rhythm.     Pulses: Normal pulses.  Pulmonary:     Effort: Pulmonary effort is normal. No respiratory distress.     Breath sounds: Normal breath sounds. No wheezing.  Abdominal:     General: There is no distension.     Palpations: Abdomen is soft. There is no mass.     Tenderness: There is abdominal tenderness. There is no guarding or rebound.     Comments: TTP of lower abd, pt states is chronic.   Musculoskeletal: Normal range of motion.  Skin:    General: Skin is warm and dry.     Capillary Refill: Capillary refill takes less than 2 seconds.  Neurological:     Mental Status: She is alert and oriented to person, place, and time.      ED Treatments / Results  Labs (all labs ordered are listed, but only abnormal results are displayed) Labs Reviewed  URINALYSIS, ROUTINE W REFLEX MICROSCOPIC - Abnormal; Notable for the following components:      Result Value   Color, Urine AMBER (*)    Specific Gravity, Urine 1.003 (*)    All other components within normal limits  PREGNANCY, URINE    EKG None  Radiology No results found.  Procedures Procedures (including critical care time)  Medications Ordered in ED Medications - No data to display   Initial Impression / Assessment and Plan / ED Course  I have reviewed the triage vital signs and the nursing notes.  Pertinent labs & imaging results that were available during my care of the patient were reviewed by  me and considered in my medical decision making (see chart for details).        Patient presenting for evaluation of pain with urination for the past day.  Physical exam reassuring, she is afebrile not tachycardic.  Patient states this occurs every month, but is worse today.  She takes medication, unknown name, but this is been improving  her symptoms.  Patient states she was able to urinate regularly in the ED.  Will check urine to ensure no infection.  UA negative for infection.  Discussed importance of follow-up with her OB/GYN for further management of her symptoms and endometriosis.  As her symptoms have improved, I do not believe she needs further imaging or labs at this time.  Return precautions given.  At this time, patient appears safe for discharge.  Patient states she understands agrees plan.   Final Clinical Impressions(s) / ED Diagnoses   Final diagnoses:  Dysuria    ED Discharge Orders         Ordered    phenazopyridine (PYRIDIUM) 200 MG tablet  3 times daily PRN     09/08/18 1639           Anna Beaird, PA-C 09/08/18 1740    Lennice Sites, DO 09/08/18 1740

## 2018-09-08 NOTE — ED Triage Notes (Signed)
Pt reports she is taking Clomid  And trying to get pregnant.

## 2018-09-08 NOTE — ED Notes (Signed)
Patient verbalizes understanding of discharge instructions . Opportunity for questions and answers were provided . Armband removed by staff ,Pt discharged from ED. W/C  offered at D/C  and Declined W/C at D/C and was escorted to lobby by RN.
